# Patient Record
Sex: Male | Born: 1955 | Race: Black or African American | Hispanic: No | Marital: Married | Smoking: Never smoker
Health system: Southern US, Community
[De-identification: ages and names within clinical notes are randomized; demographics above are authoritative.]

## PROBLEM LIST (undated history)

## (undated) DIAGNOSIS — R7303 Prediabetes: Secondary | ICD-10-CM

## (undated) DIAGNOSIS — K219 Gastro-esophageal reflux disease without esophagitis: Secondary | ICD-10-CM

## (undated) DIAGNOSIS — Z9119 Patient's noncompliance with other medical treatment and regimen: Secondary | ICD-10-CM

## (undated) DIAGNOSIS — E785 Hyperlipidemia, unspecified: Secondary | ICD-10-CM

## (undated) DIAGNOSIS — I10 Essential (primary) hypertension: Secondary | ICD-10-CM

## (undated) DIAGNOSIS — Z91199 Patient's noncompliance with other medical treatment and regimen due to unspecified reason: Secondary | ICD-10-CM

## (undated) DIAGNOSIS — E782 Mixed hyperlipidemia: Secondary | ICD-10-CM

## (undated) DIAGNOSIS — I517 Cardiomegaly: Secondary | ICD-10-CM

## (undated) DIAGNOSIS — E669 Obesity, unspecified: Secondary | ICD-10-CM

## (undated) DIAGNOSIS — E038 Other specified hypothyroidism: Secondary | ICD-10-CM

## (undated) DIAGNOSIS — I639 Cerebral infarction, unspecified: Secondary | ICD-10-CM

## (undated) DIAGNOSIS — I2510 Atherosclerotic heart disease of native coronary artery without angina pectoris: Secondary | ICD-10-CM

## (undated) HISTORY — DX: Obesity, unspecified: E66.9

## (undated) HISTORY — DX: Essential (primary) hypertension: I10

## (undated) HISTORY — DX: Other specified hypothyroidism: E03.8

## (undated) HISTORY — DX: Mixed hyperlipidemia: E78.2

## (undated) HISTORY — DX: Hyperlipidemia, unspecified: E78.5

## (undated) HISTORY — DX: Cardiomegaly: I51.7

## (undated) HISTORY — PX: NO PAST SURGERIES: SHX2092

## (undated) HISTORY — DX: Prediabetes: R73.03

## (undated) HISTORY — DX: Patient's noncompliance with other medical treatment and regimen: Z91.19

## (undated) HISTORY — DX: Patient's noncompliance with other medical treatment and regimen due to unspecified reason: Z91.199

## (undated) HISTORY — DX: Gastro-esophageal reflux disease without esophagitis: K21.9

## (undated) HISTORY — DX: Atherosclerotic heart disease of native coronary artery without angina pectoris: I25.10

---

## 2010-02-21 ENCOUNTER — Ambulatory Visit: Payer: Self-pay | Admitting: Cardiovascular Disease

## 2011-02-11 NOTE — Assessment & Plan Note (Signed)
[facility]                        [place] CARDIOLOGY OFFICE NOTE   MADDOCK, FINIGAN                          MRN:          [id number]  DATE:02/21/2010                            DOB:          Oct 24, 1955    CHIEF COMPLAINT:  Abnormal EKG and occasional chest pain.   HISTORY OF PRESENT ILLNESS:  The patient is a 55 year old black male  with past medical history significant for resistant hypertension,  obesity, hyperlipidemia. left ventricular hypertrophy  presenting for evaluation of an abnormal EKG and occasional chest  discomfort.  The patient states that he was working out at the gym  several days a week.  During the work out, he would complete  approximately 30 minutes of walking on a treadmill and do so with very  little difficulty.  He states he does occasionally have episodes in  which he feels he needs to catch his breath and these are sometimes  associated with some mild chest discomfort.  The episodes are self  limiting, they sometimes occurred during exercise and sometimes at rest.  The discomfort does not radiate.  He denies any noticeable lower  extremity edema.  He has been trying to lose weight and be compliant  with his antihypertensives.  Yesterday, he saw Dr. Allyson Sabal and because of  he had run out of his blood pressure medicines and blood pressure was  204/110.  The patient is now back on all of his antihypertensives.   PAST MEDICAL HISTORY:  As above in the HPI.   SOCIAL HISTORY:  No tobacco.  No alcohol.   FAMILY HISTORY:  Negative for premature coronary artery disease.   ALLERGIES:  No known drug allergies.   MEDICATIONS:  1. Aspirin 81 mg daily.  2. Benicar/HCT 40/12.5 mg daily.  3. Toprol-XL 50 mg daily.  4. Pravastatin 40 mg daily.  5. Clonidine 0.2 mg daily.  6. Fish oil.   REVIEW OF SYSTEMS:  As in HPI.  All other systems are reviewed and are  negative.   PHYSICAL EXAMINATION:  VITAL SIGNS:  The patient's blood  pressure is  123/77, pulse is 60, sating 94% on room air, and he weighs 258 pounds.  GENERAL:  No acute distress.  HEENT:  Normocephalic and atraumatic.  NECK:  Supple.  There is no JVD.  No carotid bruits.  HEART:  Regular rate and rhythm without murmur, rub, or gallop.  LUNGS:  Clear bilaterally.  ABDOMEN:  Soft, nontender, and nondistended.  EXTREMITIES:  Trace to 1+ bilateral lower extremity edema.  SKIN:  Warm and dry.  NEUROLOGIC:  Nonfocal.  MUSCULOSKELETAL:  5/5 bilateral upper and lower extremity strength.  PSYCHIATRIC:  The patient is appropriate.   EKG taken today in clinic, independently interpreted by myself,  demonstrates normal sinus rhythm with a probable left ventricular  hypertrophy.  There is a T-wave inversion in the inferior lateral leads  that may be secondary to left ventricular hypertrophy or could represent  ischemic heart disease.  Review of the patient's echocardiogram report  from 2009, at that time the patient had mild-to-moderate concentric left  ventricular hypertrophy.  Probable diastolic dysfunction, trace mitral  regurgitation, and mild left atrial enlargement.   ASSESSMENT:  This is a 55 year old gentleman with resistant  hypertension, hyperlipidemia, obesity, who is presenting with shortness  of breath and chest discomfort that may be secondary to coronary artery  disease.  These symptoms could also be a manifestation of the patient's  deconditioning and in fact that he is overweight.  His EKG abnormalities  could represent ischemia, but I think more likely are secondary to left  ventricular hypertrophy.   PLAN:  The patient is asked to hold off on further exercise until we  complete an exercise Cardiolite study and rule out inducible ischemia.  The need for compliance with his antihypertensives are explained and  stressed and he understands them.  I will see the patient back in clinic  after the results of the stress test.     Brayton El, MD  Electronically Signed    SGA/MedQ  DD: 02/21/2010  DT: 02/22/2010  Job #: [id number]

## 2011-02-11 NOTE — Letter (Signed)
Feb 21, 2010    Brent Bulla, M.D.  [address]  [place], [place] [address]   RE:  KAYLIN, SCHELLENBERG  MRN:  [id number]  /  DOB:  1956-05-06   Dear Dr. Marina Goodell:   I had the pleasure of seeing Mr. Pfahler in clinic this morning.  As you  know he is a 55 year old gentleman with an abnormal EKG and some  intermittent episodes of chest pain and shortness of breath.  Today in  clinic his blood pressure is 123/77 with a pulse of 60.  His EKG shows  normal sinus rhythm with left ventricular hypertrophy and inferior-  lateral T-wave inversions that may represent ischemic heart disease but  could also be secondary to the left ventricular hypertrophy.  Because of  his symptoms and risk factors, I am proceeding with an exercise  Cardiolite in order to rule out inducible ischemia.  Compliance with his  antihypertensives was stressed to the patient and he seems agreeable.   I thank you for the referral of this patient and I look forward to  following him along with you.    Sincerely,      Brayton El, MD  Electronically Signed    SGA/MedQ  DD: 02/21/2010  DT: 02/21/2010  Job #: [id number]

## 2011-06-17 ENCOUNTER — Encounter: Payer: Self-pay | Admitting: Cardiovascular Disease

## 2011-08-07 ENCOUNTER — Encounter: Payer: Self-pay | Admitting: Cardiovascular Disease

## 2013-11-28 ENCOUNTER — Encounter (HOSPITAL_COMMUNITY): Payer: Self-pay | Admitting: Emergency Medicine

## 2013-11-28 ENCOUNTER — Inpatient Hospital Stay (HOSPITAL_COMMUNITY)
Admission: EM | Admit: 2013-11-28 | Discharge: 2013-12-01 | DRG: 064 | Disposition: A | Discharge status: Home / Self Care | Payer: Self-pay | Attending: Internal Medicine | Admitting: Internal Medicine

## 2013-11-28 ENCOUNTER — Emergency Department (HOSPITAL_COMMUNITY): Payer: Self-pay

## 2013-11-28 DIAGNOSIS — I639 Cerebral infarction, unspecified: Secondary | ICD-10-CM | POA: Diagnosis present

## 2013-11-28 DIAGNOSIS — I493 Ventricular premature depolarization: Secondary | ICD-10-CM | POA: Diagnosis present

## 2013-11-28 DIAGNOSIS — G9340 Encephalopathy, unspecified: Secondary | ICD-10-CM | POA: Diagnosis present

## 2013-11-28 DIAGNOSIS — I10 Essential (primary) hypertension: Secondary | ICD-10-CM | POA: Diagnosis present

## 2013-11-28 DIAGNOSIS — Z9119 Patient's noncompliance with other medical treatment and regimen: Secondary | ICD-10-CM

## 2013-11-28 DIAGNOSIS — Z9114 Patient's other noncompliance with medication regimen: Secondary | ICD-10-CM

## 2013-11-28 DIAGNOSIS — I674 Hypertensive encephalopathy: Secondary | ICD-10-CM | POA: Diagnosis present

## 2013-11-28 DIAGNOSIS — I739 Peripheral vascular disease, unspecified: Secondary | ICD-10-CM | POA: Diagnosis present

## 2013-11-28 DIAGNOSIS — I6340 Cerebral infarction due to embolism of unspecified cerebral artery: Principal | ICD-10-CM | POA: Diagnosis present

## 2013-11-28 DIAGNOSIS — Z8673 Personal history of transient ischemic attack (TIA), and cerebral infarction without residual deficits: Secondary | ICD-10-CM

## 2013-11-28 DIAGNOSIS — R9431 Abnormal electrocardiogram [ECG] [EKG]: Secondary | ICD-10-CM | POA: Diagnosis present

## 2013-11-28 DIAGNOSIS — I160 Hypertensive urgency: Secondary | ICD-10-CM | POA: Diagnosis present

## 2013-11-28 DIAGNOSIS — I428 Other cardiomyopathies: Secondary | ICD-10-CM | POA: Diagnosis present

## 2013-11-28 DIAGNOSIS — G454 Transient global amnesia: Secondary | ICD-10-CM | POA: Diagnosis present

## 2013-11-28 DIAGNOSIS — I429 Cardiomyopathy, unspecified: Secondary | ICD-10-CM

## 2013-11-28 DIAGNOSIS — E669 Obesity, unspecified: Secondary | ICD-10-CM | POA: Diagnosis present

## 2013-11-28 DIAGNOSIS — Z6841 Body Mass Index (BMI) 40.0 and over, adult: Secondary | ICD-10-CM

## 2013-11-28 DIAGNOSIS — Z91199 Patient's noncompliance with other medical treatment and regimen due to unspecified reason: Secondary | ICD-10-CM

## 2013-11-28 DIAGNOSIS — E785 Hyperlipidemia, unspecified: Secondary | ICD-10-CM | POA: Diagnosis present

## 2013-11-28 DIAGNOSIS — G459 Transient cerebral ischemic attack, unspecified: Secondary | ICD-10-CM

## 2013-11-28 DIAGNOSIS — Z8249 Family history of ischemic heart disease and other diseases of the circulatory system: Secondary | ICD-10-CM

## 2013-11-28 DIAGNOSIS — Z7982 Long term (current) use of aspirin: Secondary | ICD-10-CM

## 2013-11-28 HISTORY — DX: Cerebral infarction, unspecified: I63.9

## 2013-11-28 LAB — CBC WITH DIFFERENTIAL/PLATELET
Basophils Absolute: 0 10*3/uL (ref 0.0–0.1)
Basophils Relative: 0 % (ref 0–1)
Eosinophils Absolute: 0.1 10*3/uL (ref 0.0–0.7)
Eosinophils Relative: 1 % (ref 0–5)
HCT: 48.8 % (ref 39.0–52.0)
Hemoglobin: 16.1 g/dL (ref 13.0–17.0)
Lymphocytes Relative: 34 % (ref 12–46)
Lymphs Abs: 2.7 10*3/uL (ref 0.7–4.0)
MCH: 29.2 pg (ref 26.0–34.0)
MCHC: 33 g/dL (ref 30.0–36.0)
MCV: 88.6 fL (ref 78.0–100.0)
Monocytes Absolute: 0.8 10*3/uL (ref 0.1–1.0)
Monocytes Relative: 10 % (ref 3–12)
Neutro Abs: 4.4 10*3/uL (ref 1.7–7.7)
Neutrophils Relative %: 56 % (ref 43–77)
Platelets: 229 10*3/uL (ref 150–400)
RBC: 5.51 MIL/uL (ref 4.22–5.81)
RDW: 13.6 % (ref 11.5–15.5)
WBC: 7.9 10*3/uL (ref 4.0–10.5)

## 2013-11-28 LAB — URINALYSIS, ROUTINE W REFLEX MICROSCOPIC
Bilirubin Urine: NEGATIVE
Glucose, UA: NEGATIVE mg/dL
Hgb urine dipstick: NEGATIVE
Ketones, ur: NEGATIVE mg/dL
Leukocytes, UA: NEGATIVE
Nitrite: NEGATIVE
Protein, ur: NEGATIVE mg/dL
Specific Gravity, Urine: 1.023 (ref 1.005–1.030)
Urobilinogen, UA: 1 mg/dL (ref 0.0–1.0)
pH: 6 (ref 5.0–8.0)

## 2013-11-28 LAB — ETHANOL: Alcohol, Ethyl (B): <11 mg/dL (ref 0–11)

## 2013-11-28 LAB — COMPREHENSIVE METABOLIC PANEL
ALT: 21 U/L (ref 0–53)
AST: 19 U/L (ref 0–37)
Albumin: 3.5 g/dL (ref 3.5–5.2)
Alkaline Phosphatase: 66 U/L (ref 39–117)
BUN: 13 mg/dL (ref 6–23)
CO2: 25 mEq/L (ref 19–32)
Calcium: 9.5 mg/dL (ref 8.4–10.5)
Chloride: 104 mEq/L (ref 96–112)
Creatinine, Ser: 1.26 mg/dL (ref 0.50–1.35)
GFR calc Af Amer: 72 mL/min — ABNORMAL LOW (ref >90)
GFR calc non Af Amer: 62 mL/min — ABNORMAL LOW (ref >90)
Glucose, Bld: 86 mg/dL (ref 70–99)
Potassium: 4.2 mEq/L (ref 3.7–5.3)
Sodium: 142 mEq/L (ref 137–147)
Total Bilirubin: 0.9 mg/dL (ref 0.3–1.2)
Total Protein: 7.7 g/dL (ref 6.0–8.3)

## 2013-11-28 LAB — RAPID URINE DRUG SCREEN, HOSP PERFORMED
Amphetamines: NOT DETECTED
Barbiturates: NOT DETECTED
Benzodiazepines: NOT DETECTED
Cocaine: NOT DETECTED
Opiates: NOT DETECTED
Tetrahydrocannabinol: NOT DETECTED

## 2013-11-28 LAB — SALICYLATE LEVEL: Salicylate Lvl: <2 mg/dL — ABNORMAL LOW (ref 2.8–20.0)

## 2013-11-28 LAB — ACETAMINOPHEN LEVEL: Acetaminophen (Tylenol), Serum: <15 ug/mL (ref 10–30)

## 2013-11-28 MED ORDER — ASPIRIN 325 MG PO TABS
325.0000 mg | ORAL_TABLET | Freq: Every day | ORAL | Status: DC
  Administered 2013-11-28 – 2013-11-30 (×3): 325 mg via ORAL
  Filled 2013-11-28 (×3): qty 1

## 2013-11-28 MED ORDER — METOPROLOL SUCCINATE ER 50 MG PO TB24
50.0000 mg | ORAL_TABLET | Freq: Every day | ORAL | Status: DC
  Administered 2013-11-29 – 2013-12-01 (×3): 50 mg via ORAL
  Filled 2013-11-28 (×4): qty 1

## 2013-11-28 MED ORDER — CLONIDINE HCL 0.1 MG PO TABS
0.2000 mg | ORAL_TABLET | Freq: Once | ORAL | Status: AC
  Administered 2013-11-28: 0.2 mg via ORAL
  Filled 2013-11-28: qty 2

## 2013-11-28 MED ORDER — LABETALOL HCL 5 MG/ML IV SOLN: 20.0000 mg | Freq: Once | INTRAVENOUS | Status: DC

## 2013-11-28 MED ORDER — LISINOPRIL 20 MG PO TABS
20.0000 mg | ORAL_TABLET | Freq: Every day | ORAL | Status: DC
  Administered 2013-11-28: 20 mg via ORAL
  Filled 2013-11-28 (×2): qty 1

## 2013-11-28 MED ORDER — ASPIRIN 300 MG RE SUPP
300.0000 mg | Freq: Every day | RECTAL | Status: DC
  Filled 2013-11-28 (×3): qty 1

## 2013-11-28 MED ORDER — HYDROCHLOROTHIAZIDE 12.5 MG PO CAPS
12.5000 mg | ORAL_CAPSULE | Freq: Once | ORAL | Status: AC
  Administered 2013-11-28: 12.5 mg via ORAL
  Filled 2013-11-28 (×2): qty 1

## 2013-11-28 MED ORDER — LABETALOL HCL 5 MG/ML IV SOLN
10.0000 mg | Freq: Once | INTRAVENOUS | Status: AC
  Administered 2013-11-28: 10 mg via INTRAVENOUS
  Filled 2013-11-28: qty 4

## 2013-11-28 MED ORDER — ENOXAPARIN SODIUM 40 MG/0.4ML ~~LOC~~ SOLN
40.0000 mg | SUBCUTANEOUS | Status: DC
  Administered 2013-11-28 – 2013-11-30 (×3): 40 mg via SUBCUTANEOUS
  Filled 2013-11-28 (×5): qty 0.4

## 2013-11-28 MED ORDER — SIMVASTATIN 20 MG PO TABS
20.0000 mg | ORAL_TABLET | Freq: Every day | ORAL | Status: DC
  Administered 2013-11-28 – 2013-12-01 (×4): 20 mg via ORAL
  Filled 2013-11-28 (×5): qty 1

## 2013-11-28 MED ORDER — SODIUM CHLORIDE 0.9 % IV SOLN
INTRAVENOUS | Status: DC
  Administered 2013-11-28: 23:00:00 via INTRAVENOUS

## 2013-11-28 MED ORDER — IRBESARTAN 300 MG PO TABS
300.0000 mg | ORAL_TABLET | Freq: Every day | ORAL | Status: DC
Start: 2013-11-28 — End: 2013-12-01
  Administered 2013-11-28 – 2013-12-01 (×4): 300 mg via ORAL
  Filled 2013-11-28 (×5): qty 1

## 2013-11-28 MED ORDER — HYDROCHLOROTHIAZIDE 25 MG PO TABS
25.0000 mg | ORAL_TABLET | Freq: Every day | ORAL | Status: DC
  Administered 2013-11-29 – 2013-12-01 (×3): 25 mg via ORAL
  Filled 2013-11-28 (×4): qty 1

## 2013-11-28 MED ORDER — HYDRALAZINE HCL 20 MG/ML IJ SOLN
10.0000 mg | Freq: Four times a day (QID) | INTRAMUSCULAR | Status: DC | PRN
  Filled 2013-11-28: qty 0.5

## 2013-11-28 NOTE — H&P (Addendum)
[facility] History and Physical  Scott Richardson UJW:[id number] DOB: 12-May-1956 DOA: 11/28/2013  Referring physician: Dr Juleen China PCP: Dr. Brent Bulla  Chief Complaint:  Altered mental status since one day  HPI:  58 year old obese male with history of hypertension and hyperlipidemia on multiple blood pressure medications (has not been taking any medications for past 9 months stating difficulty affording them ) was brought to the hospital by his family after being found confused since this morning. His wife noticed that since this morning he was talking very little and quite confused during conversation about his orientation, the dates and people. Last evening while having supper with his daughter patient reported to her that he was feeling dizzy with the room spinning around. Patient also complained of some blurry vision and numbness of his bilateral hands today. Patient denies any head trauma or recent illness. He denies any recent travel. He is not compliant with his diet and does not exercise much. Patient denies headache,  fever, chills, nausea , vomiting, chest pain, palpitations, SOB, abdominal pain, bowel or urinary symptoms. Denies change in weight or appetite. No seizure like activity was witnessed. Denies alcohol or substance abuse.  Course in the ED Patient was afebrile. Heart rate was 55. Normal respiratory rate. Blood pressure was elevated to 222/91 mmHg. He was given a dose of 0.2 mg oral clonidine, 12.5 mg of HCTZ, 300 mg all Avapro and 10 mg of IV labetalol x2 after which his blood pressure improved to 142/69 mmHg. Blood work done including CBC and basic metabolic panel was unremarkable. Urine tox was negative . Head CT was negative for acute findings. Once blood pressure started to improve he is mentation was also noted to be  better however on my evaluation the patient was still disoriented during conversation.  Review of Systems:  Constitutional: Denies fever, chills,  diaphoresis, appetite change and fatigue.  HEENT: Denies photophobia, eye pain, redness, hearing loss, ear pain, congestion, sore throat, rhinorrhea, sneezing, mouth sores, trouble swallowing, neck pain, neck stiffness and tinnitus.   Respiratory: Denies SOB, DOE, cough, chest tightness,  and wheezing.   Cardiovascular: Denies chest pain, palpitations and leg swelling.  Gastrointestinal: Denies nausea, vomiting, abdominal pain, diarrhea, constipation, blood in stool and abdominal distention.  Genitourinary: Denies dysuria, urgency, frequency, hematuria, flank pain and difficulty urinating.  Endocrine: Denies: hot or cold intolerance, polyuria, polydipsia. Musculoskeletal: Denies myalgias, back pain, joint swelling, arthralgias and gait problem.  Skin: Denies pallor, rash and wound.  Neurological: dizziness,numbness of hand, confusion  seizures, Denies syncope, weakness, light-headedness,and headaches.  Psychiatric/Behavioral: Confusion, Denies mood changes,  nervousness, sleep disturbance and agitation   Past Medical History  Diagnosis Date  . Chest pain   . HTN (hypertension)   . Obesity   . HLD (hyperlipidemia)   . LVH (left ventricular hypertrophy)    Past Surgical History  Procedure Laterality Date  . No past surgeries     Social History:  reports that he has never smoked. He has never used smokeless tobacco. He reports that he does not drink alcohol or use illicit drugs.  No Known Allergies  History reviewed. No pertinent family history.  Prior to Admission medications   Medication Sig Start Date End Date Taking? Authorizing Provider  Patient not taking any medications     Yes Historical Provider, MD      Yes Historical Provider, MD      Yes Historical Provider, MD      Yes Historical Provider, MD  Yes Historical Provider, MD      Yes Historical Provider, MD     Physical Exam:  Filed Vitals:   11/28/13 1800 11/28/13 1820 11/28/13 1831 11/28/13 1914  BP: 183/80  171/77 171/77 142/69  Pulse: 60  61 55  Temp:    98.2 F (36.8 C)  TempSrc:    Oral  Resp: 16 30  20   SpO2: 97%  97% 97%    Constitutional: Vital signs reviewed.   patient is a middle aged obese male lying in bed in no acute distress HEENT: no pallor, no icterus, moist oral mucosa, no cervical lymphadenopathy, no carotid bruit Cardiovascular: RRR, S1 normal, S2 normal, no MRG Chest: CTAB, no wheezes, rales, or rhonchi Abdominal: Soft. Non-tender, non-distended, bowel sounds are normal, no masses, organomegaly, or guarding present.  Ext: warm,  1+ pitting edema bilaterally  Neurological: A&O x2 ( confused with place and year , confused with his PCP and unable to recall people at work), cranial nerves 2-12 intact, normal motor power tone and reflexes: Normal sensations, normal cerebellar function. Gait not assessed  Labs on Admission:  Basic Metabolic Panel:  Recent Labs Lab 11/28/13 1640  NA 142  K 4.2  CL 104  CO2 25  GLUCOSE 86  BUN 13  CREATININE 1.26  CALCIUM 9.5   Liver Function Tests:  Recent Labs Lab 11/28/13 1640  AST 19  ALT 21  ALKPHOS 66  BILITOT 0.9  PROT 7.7  ALBUMIN 3.5   No results found for this basename: LIPASE, AMYLASE,  in the last 168 hours No results found for this basename: AMMONIA,  in the last 168 hours CBC:  Recent Labs Lab 11/28/13 1640  WBC 7.9  NEUTROABS 4.4  HGB 16.1  HCT 48.8  MCV 88.6  PLT 229   Cardiac Enzymes: No results found for this basename: CKTOTAL, CKMB, CKMBINDEX, TROPONINI,  in the last 168 hours BNP: No components found with this basename: POCBNP,  CBG: No results found for this basename: GLUCAP,  in the last 168 hours  Radiological Exams on Admission: Ct Head Wo Contrast  11/28/2013   CLINICAL DATA:  Encephalopathy, weakness  EXAM: CT HEAD WITHOUT CONTRAST  TECHNIQUE: Contiguous axial images were obtained from the base of the skull through the vertex without intravenous contrast.  COMPARISON:  None.  FINDINGS:  There is no evidence of mass effect, midline shift or extra-axial fluid collections. There is no evidence of a space-occupying lesion or intracranial hemorrhage. There is no evidence of a cortical-based area of acute infarction.  The ventricles and sulci are appropriate for the patient's age. The basal cisterns are patent.  Visualized portions of the orbits are unremarkable. The visualized portions of the paranasal sinuses and mastoid air cells are unremarkable.  The osseous structures are unremarkable.  IMPRESSION: No acute intracranial pathology.   Electronically Signed   By: Elige KoHetal  Patel   On: 11/28/2013 17:12    EKG: Normal sinus rhythm at 77 with PVCs, T wave inversion in the lateral leads which are unchanged from prior EKG. LVH.  Assessment/Plan  Principal Problem: Acute encephalopathy Secondary to TIA/ CVA versus hypertensive encephalopathy. Admit to telemetry under observation. Head CT on admission unremarkable. Workup for CVA with MRI of brain, MRA head, 2-D echo and carotid Dopplers. -Check hemoglobin A1c and lipid profile -Aspirin 325 mg daily. -Zocor 20 mg by mouth daily -Continue neuro checks. -Patient counseled on weight loss, diet restriction and medication compliance.   Active Problems:   Hypertensive urgency Patient  was on multiple blood pressure medications previously and noncompliant secondary to affordability. I would place him on HCTZ and lisinopril. Continue daily Toprol-XL. I discussed with him and his wife about getting $4 prescriptions for his blood pressure at [facility] . -We'll allow permissive hypertension until MRI brain rules out for CVA. -Added When necessary hydralazine. -UA unremarkable. Check 2-D echo given LVH and bilateral leg edema.     Obesity Counseled on diet and exercise.      Diet:cardiac  DVT prophylaxis: sq lovenox   Code Status: full code Family Communication: discussed with wife and daughter at bedside Disposition Plan: Home once  stable  Lillan Mccreadie [facility] Pager [phone]  Total time spent on admission :70 minutes  If 7PM-7AM, please contact night-coverage [email or web address] Password [facility] 11/28/2013, 7:42 PM

## 2013-11-28 NOTE — ED Provider Notes (Signed)
CSN: [id number]     Arrival date & time 11/28/13  1602 History   First MD Initiated Contact with Patient 11/28/13 1620     Chief Complaint  Patient presents with  . Aphasia  . Blurred Vision     (Consider location/radiation/quality/duration/timing/severity/associated sxs/prior Treatment) HPI  58 year old male brought in by wife for evaluation of changes mental status. First noticed a difference earlier today. Vision has been unusually quiet. Confused. Inappropriate comments and cement or appropriate responses to questioning. Patient is complaining of some blurred vision earlier today and some numbness in bilateral hands. He currently has no complaints though. Denies any pain anywhere. No history similar symptoms. Past history of hypertension. On multiple agents but has not taken any of them in the past several months. No fevers or chills. Denies any trauma. Denies any ingestion.   Past Medical History  Diagnosis Date  . Chest pain   . HTN (hypertension)   . Obesity   . HLD (hyperlipidemia)   . LVH (left ventricular hypertrophy)    History reviewed. No pertinent past surgical history. History reviewed. No pertinent family history. History  Substance Use Topics  . Smoking status: Never Smoker   . Smokeless tobacco: Not on file  . Alcohol Use: No    Review of Systems  All systems reviewed and negative, other than as noted in HPI.   Allergies  Review of patient's allergies indicates no known allergies.  Home Medications   Current Outpatient Rx  Name  Route  Sig  Dispense  Refill  . aspirin 81 MG tablet   Oral   Take 81 mg by mouth daily.           . cloNIDine (CATAPRES) 0.2 MG tablet   Oral   Take 0.2 mg by mouth daily.           . fish oil-omega-3 fatty acids 1000 MG capsule   Oral   Take 2 g by mouth daily.           . metoprolol (TOPROL-XL) 50 MG 24 hr tablet   Oral   Take 50 mg by mouth daily.           Marland Kitchen. olmesartan-hydrochlorothiazide (BENICAR HCT)  40-12.5 MG per tablet   Oral   Take 1 tablet by mouth daily.           . pravastatin (PRAVACHOL) 40 MG tablet   Oral   Take 40 mg by mouth daily.            BP 192/84  Pulse 70  Temp(Src) 98.2 F (36.8 C) (Oral)  Resp 16  SpO2 97% Physical Exam  Nursing note and vitals reviewed. Constitutional: He appears well-developed and well-nourished. No distress.  HENT:  Head: Normocephalic and atraumatic.  Eyes: Conjunctivae are normal. Right eye exhibits no discharge. Left eye exhibits no discharge.  Neck: Neck supple.  Cardiovascular: Normal rate, regular rhythm and normal heart sounds.  Exam reveals no gallop and no friction rub.   No murmur heard. Pulmonary/Chest: Effort normal and breath sounds normal. No respiratory distress.  Abdominal: Soft. He exhibits no distension. There is no tenderness.  Musculoskeletal: He exhibits no edema and no tenderness.  Neurological: He is alert.  Affect a little odd. Some inappropriate laughter. When asked his children's name, he gave me the name of his wife and his deceased brother. Gave October and 1920 as month and year. When asked again later in exam he then replied, "the third month" and 1915. Speech  clear. CN 2-12 intact. Strength 5/5 b/l u/l extremities. Got up from bedside chair and onto stretcher w/o apparent difficulty. Good finger to nose b/l.    Skin: Skin is warm and dry.  Psychiatric: He has a normal mood and affect. His behavior is normal. Thought content normal.    ED Course  Procedures (including critical care time) Labs Review Labs Reviewed  SALICYLATE LEVEL - Abnormal; Notable for the following:    Salicylate Lvl <2.0 (*)    All other components within normal limits  COMPREHENSIVE METABOLIC PANEL - Abnormal; Notable for the following:    GFR calc non Af Amer 62 (*)    GFR calc Af Amer 72 (*)    All other components within normal limits  ETHANOL  ACETAMINOPHEN LEVEL  CBC WITH DIFFERENTIAL  URINE RAPID DRUG SCREEN (HOSP  PERFORMED)  URINALYSIS, ROUTINE W REFLEX MICROSCOPIC   Imaging Review Ct Head Wo Contrast  11/28/2013   CLINICAL DATA:  Encephalopathy, weakness  EXAM: CT HEAD WITHOUT CONTRAST  TECHNIQUE: Contiguous axial images were obtained from the base of the skull through the vertex without intravenous contrast.  COMPARISON:  None.  FINDINGS: There is no evidence of mass effect, midline shift or extra-axial fluid collections. There is no evidence of a space-occupying lesion or intracranial hemorrhage. There is no evidence of a cortical-based area of acute infarction.  The ventricles and sulci are appropriate for the patient's age. The basal cisterns are patent.  Visualized portions of the orbits are unremarkable. The visualized portions of the paranasal sinuses and mastoid air cells are unremarkable.  The osseous structures are unremarkable.  IMPRESSION: No acute intracranial pathology.   Electronically Signed   By: Elige Ko   On: 11/28/2013 17:12     EKG Interpretation   Date/Time:  Monday November 28 2013 16:34:08 EST Ventricular Rate:  77 PR Interval:  172 QRS Duration: 100 QT Interval:  384 QTC Calculation: 435 R Axis:   -103 Text Interpretation:  Ectopic atrial rhythm Ventricular premature complex  Left atrial enlargement  Abnormal T, consider ischemia lateral leads but  similar to previous EKG LVH Confirmed by Juleen China  MD, Melody Savidge ([id number]) on  11/28/2013 6:30:15 PM      MDM   Final diagnoses:  Hypertensive encephalopathy     58 year old with encephalopathy. Suspect hypertensive in nature. Neuro exam is otherwise nonfocal. He is afebrile. No history of trauma. No apparent ingestion. CT of the head w/o acute findings. istory of hypertension or on file and has been off his medications for several months. His mental status has improved with improving blood pressure (given home meds and additional labetalol), but is not completely back to his baseline.   Raeford Razor, MD 11/28/13 [phone]

## 2013-11-28 NOTE — Progress Notes (Signed)
   CARE MANAGEMENT ED NOTE 11/28/2013  Patient:  NIMAI, BYARS   Account Number:  [id number]  Date Initiated:  11/28/2013  Documentation initiated by:  Radford Pax  Subjective/Objective Assessment:   Patient presents to ED with confusion, lurred vision and numbness in bilateral hands.     Subjective/Objective Assessment Detail:   Patient with pmhx of chest pain, htn, hld, lvh.     Action/Plan:   Action/Plan Detail:   Anticipated DC Date:       Status Recommendation to Physician:   Result of Recommendation:    Other ED Services  Consult Working Plan    DC Planning Services  Other  PCP issues    Choice offered to / List presented to:            Status of service:  Completed, signed off  ED Comments:   ED Comments Detail:  EDCM psoke to patient at bedisde.  Patient confirms he does not have any insurance or a pcp.  EDCM provided patient with a lsit of pcps who accept self pay patients, list of discounted pharmacies and website needymeds.org for medication assistance, information regarding Medicaid, phone number to inquire about the orange card, list of financila assistance in the community sucha as local churches and [employer] and dental assistance for uninsured patients.  Patient thankful for resources.  EDCM placed resources in patient's belongings bag.

## 2013-11-28 NOTE — ED Notes (Signed)
Per wife pt has had slurred speech today as well as blurred vision. Wife also sts pt is having hard time concentrating, is slow to respond, and "just not himself". Wife sts pt has been repeating questions when asked, last time he saw him normal was last night when she went top sleep. Pt also reports bloody stools.

## 2013-11-29 ENCOUNTER — Encounter (HOSPITAL_COMMUNITY): Payer: Self-pay | Admitting: Neurology

## 2013-11-29 ENCOUNTER — Observation Stay (HOSPITAL_COMMUNITY): Payer: Self-pay

## 2013-11-29 DIAGNOSIS — I6350 Cerebral infarction due to unspecified occlusion or stenosis of unspecified cerebral artery: Secondary | ICD-10-CM

## 2013-11-29 LAB — HEMOGLOBIN A1C
Hgb A1c MFr Bld: 6.2 % — ABNORMAL HIGH (ref <5.7)
Mean Plasma Glucose: 131 mg/dL — ABNORMAL HIGH (ref <117)

## 2013-11-29 LAB — LIPID PANEL
Cholesterol: 176 mg/dL (ref 0–200)
HDL: 27 mg/dL — ABNORMAL LOW (ref >39)
LDL CALC: 130 mg/dL — AB (ref 0–99)
TRIGLYCERIDES: 94 mg/dL (ref <150)
Total CHOL/HDL Ratio: 6.5 RATIO
VLDL: 19 mg/dL (ref 0–40)

## 2013-11-29 NOTE — Progress Notes (Signed)
OT Cancellation Note  Patient Details Name: Scott Richardson MRN: [id number] DOB: 1956-03-08   Cancelled Treatment:    Reason Eval/Treat Not Completed: Other (comment)  Pt is being transferred to [facility] today.  + CVA.    Rin Gorton 11/29/2013, 12:34 PM Marica Otter, OTR/L [phone] 11/29/2013

## 2013-11-29 NOTE — Progress Notes (Signed)
UR completed. Patient changed to inpatient- acute CVA 

## 2013-11-29 NOTE — Progress Notes (Signed)
PT Cancellation Note  Patient Details Name: Scott Richardson MRN: [id number] DOB: 1956/08/13   Cancelled Treatment:    Reason Eval/Treat Not Completed: Medical issues which prohibited therapy + CVA per wife/RN-pt to transfer to [facility]. Will hold PT today and check back another day. Thanks. )   Rebeca Alert, MPT Pager: [phone]

## 2013-11-29 NOTE — Progress Notes (Signed)
[facility] PROGRESS NOTE  Scott Richardson ZOX:[id number] DOB: Oct 12, 1955 DOA: 11/28/2013 PCP: Abigail Miyamoto, MD  Assessment/Plan: Acute CVA -Confirmed by MRI. -Start ASA for secondary stroke prevention. -ECHO/Dopplers/PT/OT. -Neurology evaluation has been requested.  HTN -Allow some permissive HTN given acute CVA. -Continue current medications..  Code Status: Full Code Family Communication: Wife at bedside updated on plan of care.  Disposition Plan: To be determined.   Consultants:  Neurology   Antibiotics:  None   Subjective: Still a little confused.  Objective: Filed Vitals:   11/28/13 2235 11/29/13 0455 11/29/13 1009 11/29/13 1335  BP: 146/66 126/70 150/83 153/89  Pulse: 56 54 55 53  Temp:  98.2 F (36.8 C)  98.1 F (36.7 C)  TempSrc:  Oral  Oral  Resp:  16  18  Height:      Weight:      SpO2:  100%  97%    Intake/Output Summary (Last 24 hours) at 11/29/13 1700 Last data filed at 11/29/13 1400  Gross per 24 hour  Intake  737.5 ml  Output      0 ml  Net  737.5 ml   Filed Weights   11/28/13 2108  Weight: 123.061 kg (271 lb 4.8 oz)    Exam:   General:  awake  Cardiovascular: RRR  Respiratory: CTA B  Abdomen: S/NT/ND/+BS  Extremities: no C/C/E/+pulses   Data Reviewed: Basic Metabolic Panel:  Recent Labs Lab 11/28/13 1640  NA 142  K 4.2  CL 104  CO2 25  GLUCOSE 86  BUN 13  CREATININE 1.26  CALCIUM 9.5   Liver Function Tests:  Recent Labs Lab 11/28/13 1640  AST 19  ALT 21  ALKPHOS 66  BILITOT 0.9  PROT 7.7  ALBUMIN 3.5   No results found for this basename: LIPASE, AMYLASE,  in the last 168 hours No results found for this basename: AMMONIA,  in the last 168 hours CBC:  Recent Labs Lab 11/28/13 1640  WBC 7.9  NEUTROABS 4.4  HGB 16.1  HCT 48.8  MCV 88.6  PLT 229   Cardiac Enzymes: No results found for this basename: CKTOTAL, CKMB, CKMBINDEX, TROPONINI,  in the last 168 hours BNP (last 3  results) No results found for this basename: PROBNP,  in the last 8760 hours CBG: No results found for this basename: GLUCAP,  in the last 168 hours  No results found for this or any previous visit (from the past 240 hour(s)).   Studies: Ct Head Wo Contrast  11/28/2013   CLINICAL DATA:  Encephalopathy, weakness  EXAM: CT HEAD WITHOUT CONTRAST  TECHNIQUE: Contiguous axial images were obtained from the base of the skull through the vertex without intravenous contrast.  COMPARISON:  None.  FINDINGS: There is no evidence of mass effect, midline shift or extra-axial fluid collections. There is no evidence of a space-occupying lesion or intracranial hemorrhage. There is no evidence of a cortical-based area of acute infarction.  The ventricles and sulci are appropriate for the patient's age. The basal cisterns are patent.  Visualized portions of the orbits are unremarkable. The visualized portions of the paranasal sinuses and mastoid air cells are unremarkable.  The osseous structures are unremarkable.  IMPRESSION: No acute intracranial pathology.   Electronically Signed   By: Elige Ko   On: 11/28/2013 17:12   Mr Maxine Glenn Head Wo Contrast  11/29/2013   CLINICAL DATA:  TIA.  Encephalopathy it weakness.  Confusion.  EXAM: MRI HEAD WITHOUT CONTRAST  MRA HEAD WITHOUT CONTRAST  TECHNIQUE:  Multiplanar, multiecho pulse sequences of the brain and surrounding structures were obtained without intravenous contrast. Angiographic images of the head were obtained using MRA technique without contrast.  COMPARISON:  CT head without contrast 11/28/2013  FINDINGS: MRI HEAD FINDINGS  An acute nonhemorrhagic infarct within the left lentiform nucleus measures 3.9 x 1.5 x 2.6 cm. This extends superiorly to the body the caudate. T2 changes are associated. There is partial effacement of the left lateral ventricle compatible with mass effect. No other areas of acute infarct are evident. Minimal periventricular white matter changes are  otherwise within normal limits for age.  No hemorrhage or mass lesion is present. Flow is present in the major intracranial arteries. The globes and orbits are intact. The paranasal sinuses and mastoid air cells are clear.  MRA HEAD FINDINGS  There is a focal stenosis of the right supraclinoid internal carotid artery just beyond the posterior communicating artery. Poststenotic dilation versus fusiform aneurysm measures 2.8 mm. The ICA terminus is normal. The right A1 segment is the dominant vessel. There is a focal high-grade stenosis at the origin of the left A1 segment. The M1 segments are within normal limits bilaterally. The MCA bifurcations are within normal limits. There is mild narrowing of the superior left M2 division. Segmental narrowing is present at the more distal MCA branch vessels and ACA branch vessels bilaterally.  The vertebral arteries are small bilaterally. There is no focal stenosis. The basilar artery is small. Fetal type posterior cerebral arteries are present bilaterally with a small right P1 segment. There is moderate attenuation of PCA branch vessels bilaterally.  IMPRESSION: 1. Acute nonhemorrhagic infarct involving the left lentiform nucleus extending to the left caudate. 2. 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 3. 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 4. Mild narrowing of the left superior division left M2 segment. 5. High-grade stenosis of the proximal left A1 segment. This corresponds with the area of infarction. Critical Value/emergent results were called by telephone at the time of interpretation on 11/29/2013 at 8:33 AM to Dr. Ardyth Harps, who verbally acknowledged these results.   Electronically Signed   By: Gennette Pac M.D.   On: 11/29/2013 08:34   Mr Brain Wo Contrast  11/29/2013   CLINICAL DATA:  TIA.  Encephalopathy it  weakness.  Confusion.  EXAM: MRI HEAD WITHOUT CONTRAST  MRA HEAD WITHOUT CONTRAST  TECHNIQUE: Multiplanar, multiecho pulse sequences of the brain and surrounding structures were obtained without intravenous contrast. Angiographic images of the head were obtained using MRA technique without contrast.  COMPARISON:  CT head without contrast 11/28/2013  FINDINGS: MRI HEAD FINDINGS  An acute nonhemorrhagic infarct within the left lentiform nucleus measures 3.9 x 1.5 x 2.6 cm. This extends superiorly to the body the caudate. T2 changes are associated. There is partial effacement of the left lateral ventricle compatible with mass effect. No other areas of acute infarct are evident. Minimal periventricular white matter changes are otherwise within normal limits for age.  No hemorrhage or mass lesion is present. Flow is present in the major intracranial arteries. The globes and orbits are intact. The paranasal sinuses and mastoid air cells are clear.  MRA HEAD FINDINGS  There is a focal stenosis of the right supraclinoid internal carotid artery just beyond the posterior communicating artery. Poststenotic dilation versus fusiform aneurysm measures 2.8 mm. The ICA terminus is normal. The right A1 segment is the dominant vessel.  There is a focal high-grade stenosis at the origin of the left A1 segment. The M1 segments are within normal limits bilaterally. The MCA bifurcations are within normal limits. There is mild narrowing of the superior left M2 division. Segmental narrowing is present at the more distal MCA branch vessels and ACA branch vessels bilaterally.  The vertebral arteries are small bilaterally. There is no focal stenosis. The basilar artery is small. Fetal type posterior cerebral arteries are present bilaterally with a small right P1 segment. There is moderate attenuation of PCA branch vessels bilaterally.  IMPRESSION: 1. Acute nonhemorrhagic infarct involving the left lentiform nucleus extending to the left  caudate. 2. 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 3. 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 4. Mild narrowing of the left superior division left M2 segment. 5. High-grade stenosis of the proximal left A1 segment. This corresponds with the area of infarction. Critical Value/emergent results were called by telephone at the time of interpretation on 11/29/2013 at 8:33 AM to Dr. Ardyth HarpsHernandez, who verbally acknowledged these results.   Electronically Signed   By: Gennette Pachris  Mattern M.D.   On: 11/29/2013 08:34    Scheduled Meds: . aspirin  300 mg Rectal Daily   Or  . aspirin  325 mg Oral Daily  . enoxaparin (LOVENOX) injection  40 mg Subcutaneous Q24H  . hydrochlorothiazide  25 mg Oral Daily  . irbesartan  300 mg Oral Daily  . metoprolol succinate  50 mg Oral Daily  . simvastatin  20 mg Oral q1800   Continuous Infusions: . sodium chloride 75 mL/hr at 11/28/13 2241    Principal Problem:   CVA (cerebral infarction) Active Problems:   Transient global amnesia   Hypertensive urgency   Obesity   Encephalopathy acute   Hypertensive encephalopathy    Time spent: 35 minutes. Greater than 50% of this time was spent in direct contact with the patient coordinating care.    Chaya JanHERNANDEZ ACOSTA,ESTELA  [facility] Pager [phone]  If 7PM-7AM, please contact night-coverage at [email or web address], password [email or web address] 11/29/2013, 5:00 PM  LOS: 1 day

## 2013-11-29 NOTE — Care Management Note (Signed)
    Page 1 of 1   11/29/2013     12:34:47 PM   CARE MANAGEMENT NOTE 11/29/2013  Patient:  Scott Richardson, Scott Richardson   Account Number:  [id number]  Date Initiated:  11/29/2013  Documentation initiated by:  Lanier Clam  Subjective/Objective Assessment:   58 Y/O M ADMITTED W/TIA     Action/Plan:   FROM HOME.NO INSURANCE.NO PCP.   Anticipated DC Date:  12/02/2013   Anticipated DC Plan:  HOME/SELF CARE      DC Planning Services  CM consult      Choice offered to / List presented to:             Status of service:  In process, will continue to follow Medicare Important Message given?   (If response is "NO", the following Medicare IM given date fields will be blank) Date Medicare IM given:   Date Additional Medicare IM given:    Discharge Disposition:  ACUTE TO ACUTE TRANS  Per UR Regulation:  Reviewed for med. necessity/level of care/duration of stay  If discussed at Long Length of Stay Meetings, dates discussed:    Comments:  11/29/13 Neomi Laidler RN,BSN NCM [phone] ED CM(SEE NOTE) HAS ALREADY PROVIDED W/RESOURCES.+MRI-STROKE.FOR TRANSFER TO [facility] PER MD.

## 2013-11-29 NOTE — Progress Notes (Signed)
Bilateral carotid artery duplex:  1-39% ICA stenosis.  Vertebral artery flow is antegrade.     

## 2013-11-29 NOTE — Consult Note (Signed)
Referring Physician: Ardyth Harps    Chief Complaint: stroke  HPI:                                                                                                                                         Scott Richardson is an 58 y.o. male who has not been taking his BP medications or ASA for the last 9-10 months due to cost. He was brought to the hospital after he was found to be confused, having symptoms of dizziness, and "just not answering questions or being a upbeat as usual".  He was brought to the ED with BP 222/91 and he was admitted. 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 Carotid dopplers were performed showing no ICA stenosis. LDL 130, A1c 6.2.  Echo pending.   Wife at bedside feels the medication was not necessarily a cost issue but but both cost and non-compliance.   Date last known well: Date: 11/27/2013 Time last known well: Unable to determine tPA Given: No: out of window  Past Medical History  Diagnosis Date  . Chest pain   . HTN (hypertension)   . Obesity   . HLD (hyperlipidemia)   . LVH (left ventricular hypertrophy)     Past Surgical History  Procedure Laterality Date  . No past surgeries      Family History  Problem Relation Age of Onset  . Hypertension Mother   . Hypertension Mother   . Hypertension Father    Social History:  reports that he has never smoked. He has never used smokeless tobacco. He reports that he does not drink alcohol or use illicit drugs.  Allergies: No Known Allergies  Medications:                                                                                                                           Prior to Admission:  Prescriptions prior to admission  Medication Sig Dispense Refill  . aspirin 81 MG tablet Take 81 mg by mouth daily.        . cloNIDine (CATAPRES) 0.2 MG tablet Take 0.2 mg by mouth daily.        . fish oil-omega-3 fatty acids 1000 MG capsule Take 2 g by  mouth daily.        . metoprolol (TOPROL-XL) 50  MG 24 hr tablet Take 50 mg by mouth daily.        Marland Kitchen olmesartan-hydrochlorothiazide (BENICAR HCT) 40-12.5 MG per tablet Take 1 tablet by mouth daily.        . pravastatin (PRAVACHOL) 40 MG tablet Take 40 mg by mouth daily.         Scheduled: . aspirin  300 mg Rectal Daily   Or  . aspirin  325 mg Oral Daily  . enoxaparin (LOVENOX) injection  40 mg Subcutaneous Q24H  . hydrochlorothiazide  25 mg Oral Daily  . irbesartan  300 mg Oral Daily  . metoprolol succinate  50 mg Oral Daily  . simvastatin  20 mg Oral q1800    ROS:                                                                                                                                       History obtained from the patient  General ROS: negative for - chills, fatigue, fever, night sweats, weight gain or weight loss Psychological ROS: negative for - behavioral disorder, hallucinations, memory difficulties, mood swings or suicidal ideation Ophthalmic ROS: negative for - blurry vision, double vision, eye pain or loss of vision ENT ROS: negative for - epistaxis, nasal discharge, oral lesions, sore throat, tinnitus or vertigo Allergy and Immunology ROS: negative for - hives or itchy/watery eyes Hematological and Lymphatic ROS: negative for - bleeding problems, bruising or swollen lymph nodes Endocrine ROS: negative for - galactorrhea, hair pattern changes, polydipsia/polyuria or temperature intolerance Respiratory ROS: negative for - cough, hemoptysis, shortness of breath or wheezing Cardiovascular ROS: negative for - chest pain, dyspnea on exertion, edema or irregular heartbeat Gastrointestinal ROS: negative for - abdominal pain, diarrhea, hematemesis, nausea/vomiting or stool incontinence Genito-Urinary ROS: negative for - dysuria, hematuria, incontinence or urinary frequency/urgency Musculoskeletal ROS: negative for - joint swelling or muscular weakness Neurological ROS: as noted  in HPI Dermatological ROS: negative for rash and skin lesion changes  Neurologic Examination:                                                                                                      Blood pressure 153/89, pulse 53, temperature 98.1 F (36.7 C), temperature source Oral, resp. rate 18, height 5\' 7"  (1.702 m), weight 123.061 kg (271 lb 4.8 oz), SpO2 97.00%.   Mental Status: Alert, oriented, thought content appropriate.  Speech fluent without evidence of aphasia.  Able to follow 3 step commands without difficulty. Cranial Nerves: II:  Discs flat bilaterally; Visual fields grossly normal, pupils equal, round, reactive to light and accommodation III,IV, VI: ptosis not present, extra-ocular motions intact bilaterally V,VII: smile asymmetric right, facial light touch sensation normal bilaterally VIII: hearing normal bilaterally IX,X: gag reflex present XI: bilateral shoulder shrug XII: midline tongue extension without atrophy or fasciculations  Motor: Right : Upper extremity   5/5    Left:     Upper extremity   5/5  Lower extremity   5/5     Lower extremity   5/5 Tone and bulk:normal tone throughout; no atrophy noted Sensory: Pinprick and light touch intact throughout, bilaterally Deep Tendon Reflexes:  Right: Upper Extremity   Left: Upper extremity   biceps (C-5 to C-6) 2/4   biceps (C-5 to C-6) 2/4 tricep (C7) 2/4    triceps (C7) 2/4 Brachioradialis (C6) 2/4  Brachioradialis (C6) 2/4  Lower Extremity Lower Extremity  quadriceps (L-2 to L-4) 2/4   quadriceps (L-2 to L-4) 2/4 Achilles (S1) 2/4   Achilles (S1) 2/4  Plantars: Right: downgoing   Left: downgoing Cerebellar: normal finger-to-nose,  normal heel-to-shin test Gait: not tested due to leads.  CV: pulses palpable throughout    Lab Results: Basic Metabolic Panel:  Recent Labs Lab 11/28/13 1640  NA 142  K 4.2  CL 104  CO2 25  GLUCOSE 86  BUN 13  CREATININE 1.26  CALCIUM 9.5    Liver Function  Tests:  Recent Labs Lab 11/28/13 1640  AST 19  ALT 21  ALKPHOS 66  BILITOT 0.9  PROT 7.7  ALBUMIN 3.5   No results found for this basename: LIPASE, AMYLASE,  in the last 168 hours No results found for this basename: AMMONIA,  in the last 168 hours  CBC:  Recent Labs Lab 11/28/13 1640  WBC 7.9  NEUTROABS 4.4  HGB 16.1  HCT 48.8  MCV 88.6  PLT 229    Cardiac Enzymes: No results found for this basename: CKTOTAL, CKMB, CKMBINDEX, TROPONINI,  in the last 168 hours  Lipid Panel:  Recent Labs Lab 11/29/13 0537  CHOL 176  TRIG 94  HDL 27*  CHOLHDL 6.5  VLDL 19  LDLCALC 740*    CBG: No results found for this basename: GLUCAP,  in the last 168 hours  Microbiology: No results found for this or any previous visit.  Coagulation Studies: No results found for this basename: LABPROT, INR,  in the last 72 hours  Imaging: Ct Head Wo Contrast  11/28/2013   CLINICAL DATA:  Encephalopathy, weakness  EXAM: CT HEAD WITHOUT CONTRAST  TECHNIQUE: Contiguous axial images were obtained from the base of the skull through the vertex without intravenous contrast.  COMPARISON:  None.  FINDINGS: There is no evidence of mass effect, midline shift or extra-axial fluid collections. There is no evidence of a space-occupying lesion or intracranial hemorrhage. There is no evidence of a cortical-based area of acute infarction.  The ventricles and sulci are appropriate for the patient's age. The basal cisterns are patent.  Visualized portions of the orbits are unremarkable. The visualized portions of the paranasal sinuses and mastoid air cells are unremarkable.  The osseous structures are unremarkable.  IMPRESSION: No acute intracranial pathology.   Electronically Signed   By: Elige Ko   On: 11/28/2013 17:12   Mr Maxine Glenn Head Wo Contrast  11/29/2013   CLINICAL DATA:  TIA.  Encephalopathy it weakness.  Confusion.  EXAM: MRI HEAD WITHOUT CONTRAST  MRA HEAD WITHOUT CONTRAST  TECHNIQUE: Multiplanar,  multiecho  pulse sequences of the brain and surrounding structures were obtained without intravenous contrast. Angiographic images of the head were obtained using MRA technique without contrast.  COMPARISON:  CT head without contrast 11/28/2013  FINDINGS: MRI HEAD FINDINGS  An acute nonhemorrhagic infarct within the left lentiform nucleus measures 3.9 x 1.5 x 2.6 cm. This extends superiorly to the body the caudate. T2 changes are associated. There is partial effacement of the left lateral ventricle compatible with mass effect. No other areas of acute infarct are evident. Minimal periventricular white matter changes are otherwise within normal limits for age.  No hemorrhage or mass lesion is present. Flow is present in the major intracranial arteries. The globes and orbits are intact. The paranasal sinuses and mastoid air cells are clear.  MRA HEAD FINDINGS  There is a focal stenosis of the right supraclinoid internal carotid artery just beyond the posterior communicating artery. Poststenotic dilation versus fusiform aneurysm measures 2.8 mm. The ICA terminus is normal. The right A1 segment is the dominant vessel. There is a focal high-grade stenosis at the origin of the left A1 segment. The M1 segments are within normal limits bilaterally. The MCA bifurcations are within normal limits. There is mild narrowing of the superior left M2 division. Segmental narrowing is present at the more distal MCA branch vessels and ACA branch vessels bilaterally.  The vertebral arteries are small bilaterally. There is no focal stenosis. The basilar artery is small. Fetal type posterior cerebral arteries are present bilaterally with a small right P1 segment. There is moderate attenuation of PCA branch vessels bilaterally.  IMPRESSION: 1. Acute nonhemorrhagic infarct involving the left lentiform nucleus extending to the left caudate. 2. 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 3. 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 4. Mild narrowing of the left superior division left M2 segment. 5. High-grade stenosis of the proximal left A1 segment. This corresponds with the area of infarction. Critical Value/emergent results were called by telephone at the time of interpretation on 11/29/2013 at 8:33 AM to Dr. Ardyth Harps, who verbally acknowledged these results.   Electronically Signed   By: Gennette Pac M.D.   On: 11/29/2013 08:34   Mr Brain Wo Contrast  11/29/2013   CLINICAL DATA:  TIA.  Encephalopathy it weakness.  Confusion.  EXAM: MRI HEAD WITHOUT CONTRAST  MRA HEAD WITHOUT CONTRAST  TECHNIQUE: Multiplanar, multiecho pulse sequences of the brain and surrounding structures were obtained without intravenous contrast. Angiographic images of the head were obtained using MRA technique without contrast.  COMPARISON:  CT head without contrast 11/28/2013  FINDINGS: MRI HEAD FINDINGS  An acute nonhemorrhagic infarct within the left lentiform nucleus measures 3.9 x 1.5 x 2.6 cm. This extends superiorly to the body the caudate. T2 changes are associated. There is partial effacement of the left lateral ventricle compatible with mass effect. No other areas of acute infarct are evident. Minimal periventricular white matter changes are otherwise within normal limits for age.  No hemorrhage or mass lesion is present. Flow is present in the major intracranial arteries. The globes and orbits are intact. The paranasal sinuses and mastoid air cells are clear.  MRA HEAD FINDINGS  There is a focal stenosis of the right supraclinoid internal carotid artery just beyond the posterior communicating artery. Poststenotic dilation versus fusiform aneurysm measures 2.8 mm. The ICA terminus is normal. The right A1 segment is the dominant vessel. There is a  focal high-grade stenosis at the origin of the left A1 segment. The M1  segments are within normal limits bilaterally. The MCA bifurcations are within normal limits. There is mild narrowing of the superior left M2 division. Segmental narrowing is present at the more distal MCA branch vessels and ACA branch vessels bilaterally.  The vertebral arteries are small bilaterally. There is no focal stenosis. The basilar artery is small. Fetal type posterior cerebral arteries are present bilaterally with a small right P1 segment. There is moderate attenuation of PCA branch vessels bilaterally.  IMPRESSION: 1. Acute nonhemorrhagic infarct involving the left lentiform nucleus extending to the left caudate. 2. 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 3. 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 4. Mild narrowing of the left superior division left M2 segment. 5. High-grade stenosis of the proximal left A1 segment. This corresponds with the area of infarction. Critical Value/emergent results were called by telephone at the time of interpretation on 11/29/2013 at 8:33 AM to Dr. Ardyth HarpsHernandez, who verbally acknowledged these results.   Electronically Signed   By: Gennette Pachris  Mattern M.D.   On: 11/29/2013 08:34       Assessment and plan discussed with with attending physician and they are in agreement.    Felicie MornDavid Smith PA-C [facility] [phone]  11/29/2013, 4:38 PM   Assessment: 58 y.o. male with new acute left internal capsule in the setting of BP 222/91 and BP medication non-compliance.  Etiology likely poorly controled HTN and small vessel disease.   Stroke Risk Factors - hyperlipidemia and hypertension  Recommend: 1) ASA daily 2) Echo pending 3) PT/OT 4) BP control 5) LDL <100  Patient seen and examined together with physician assistant and I concur with the assessment and plan.  Wyatt Portelasvaldo Sadie Hazelett, MD

## 2013-11-29 NOTE — Evaluation (Signed)
Physical Therapy Evaluation Patient Details Name: Scott Richardson MRN: [id number] DOB: [birth year] Today's Date: 11/29/2013 Time: 2595-6387 PT Time Calculation (min): 26 min  PT Assessment / Plan / Recommendation History of Present Illness  Patient is a 58 yo male s/p 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  Clinical Impression  Patient independent with activity, educated on BE FAST stroke signs.  Patient able to perform higher level balance tasks without difficulty. Able to navigate unit without cues.  Patient does report some memory deficits, may benefit from SLP evaluation.     PT Assessment  Patent does not need any further PT services    Follow Up Recommendations    NO PT FOLLOW UP                   Precautions / Restrictions Restrictions Weight Bearing Restrictions: No   Pertinent Vitals/Pain No pain at this time, VSS      Mobility  Bed Mobility Overal bed mobility: Independent Transfers Overall transfer level: Independent Ambulation/Gait Ambulation/Gait assistance: Independent Ambulation Distance (Feet): 640 Feet Assistive device: None Gait Pattern/deviations: WFL(Within Functional Limits) Gait velocity: wfl Gait velocity interpretation: at or above normal speed for age/gender Stairs: Yes Stairs assistance: Independent Stair Management: No rails;Alternating pattern;Forwards Number of Stairs: 5 (x2)      PT Goals(Current goals can be found in the care plan section) Acute Rehab PT Goals Patient Stated Goal: to go home PT Goal Formulation: No goals set, d/c therapy Time For Goal Achievement: 12/13/13  Visit Information  Last PT Received On: 11/29/13 Assistance Needed: +1 Reason Eval/Treat Not Completed: Medical issues which prohibited therapy (+ CVA per wife/RN-pt to transfer to [facility]. Will hold PT today and check back another day. Thanks. ) History of Present Illness: Patient is a 58 yo male s/p Acute nonhemorrhagic infarct involving  the left lentiform nucleus.       Prior Functioning  Home Living Family/patient expects to be discharged to:: Private residence Living Arrangements: Spouse/significant other Available Help at Discharge: Family Type of Home: House Home Access: Stairs to enter Secretary/administrator of Steps: 3 Entrance Stairs-Rails: None Home Layout: One level Home Equipment: None Prior Function Level of Independence: Independent Communication Communication: No difficulties Dominant Hand: Right    Cognition  Cognition Arousal/Alertness: Awake/alert Behavior During Therapy: WFL for tasks assessed/performed Overall Cognitive Status: Within Functional Limits for tasks assessed    Extremity/Trunk Assessment Upper Extremity Assessment Upper Extremity Assessment: Overall WFL for tasks assessed Lower Extremity Assessment Lower Extremity Assessment: Overall WFL for tasks assessed   Balance Balance Overall balance assessment: No apparent balance deficits (not formally assessed) Standardized Balance Assessment Standardized Balance Assessment : Dynamic Gait Index Dynamic Gait Index Level Surface: Normal Change in Gait Speed: Normal Gait with Horizontal Head Turns: Normal Gait with Vertical Head Turns: Normal Gait and Pivot Turn: Normal Step Over Obstacle: Normal Step Around Obstacles: Normal Steps: Normal Total Score: 24  End of Session PT - End of Session Equipment Utilized During Treatment: Gait belt Activity Tolerance: Patient tolerated treatment well Patient left: in bed;with call bell/phone within reach;with bed alarm set;with family/visitor present Nurse Communication: Mobility status  GP     Fabio Asa 11/29/2013, 3:18 PM Charlotte Crumb, PT DPT  [phone]

## 2013-11-30 DIAGNOSIS — G9340 Encephalopathy, unspecified: Secondary | ICD-10-CM

## 2013-11-30 DIAGNOSIS — I6350 Cerebral infarction due to unspecified occlusion or stenosis of unspecified cerebral artery: Secondary | ICD-10-CM

## 2013-11-30 DIAGNOSIS — G459 Transient cerebral ischemic attack, unspecified: Secondary | ICD-10-CM

## 2013-11-30 MED ORDER — CLOPIDOGREL BISULFATE 75 MG PO TABS
75.0000 mg | ORAL_TABLET | Freq: Every day | ORAL | Status: DC
  Administered 2013-11-30 – 2013-12-01 (×2): 75 mg via ORAL
  Filled 2013-11-30 (×2): qty 1

## 2013-11-30 MED ORDER — ASPIRIN 81 MG PO TABS
81.0000 mg | ORAL_TABLET | Freq: Every day | ORAL | Status: DC
  Administered 2013-12-01: 81 mg via ORAL
  Filled 2013-11-30: qty 1

## 2013-11-30 MED ORDER — SODIUM CHLORIDE 0.9 % IV SOLN: INTRAVENOUS | Status: DC

## 2013-11-30 NOTE — Evaluation (Signed)
Occupational Therapy Evaluation Patient Details Name: Scott BankerJames Richardson MRN: [id number] DOB: Sep 22, 1956 Today's Date: 11/30/2013 Time: [id number] OT Time Calculation (min): 22 min  OT Assessment / Plan / Recommendation History of present illness Patient is a 58 yo male s/p 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   Clinical Impression   Pt admitted with above. He demonstrates the below listed deficits and will benefit from continued OT to maximize safety and independence with BADLs.  Pt presents to OT with cognitive and visual deficits.  Pt unable to read, and provides contradictory info to questions.  He will need 24 hour supervision at discharge.  Recommend no driving, and OPOT.     OT Assessment  Patient needs continued OT Services    Follow Up Recommendations  Outpatient OT;Supervision/Assistance - 24 hour    Barriers to Discharge   unsure - wife not present  Equipment Recommendations  None recommended by OT    Recommendations for Other Services    Frequency  Min 2X/week    Precautions / Restrictions Restrictions Weight Bearing Restrictions: No   Pertinent Vitals/Pain     ADL  Eating/Feeding: Independent Where Assessed - Eating/Feeding: Edge of bed;Chair Grooming: Wash/dry hands;Wash/dry face;Teeth care;Brushing hair;Supervision/safety Where Assessed - Grooming: Unsupported standing Upper Body Bathing: Supervision/safety Where Assessed - Upper Body Bathing: Supported sitting Lower Body Bathing: Supervision/safety Where Assessed - Lower Body Bathing: Unsupported sit to stand Upper Body Dressing: Supervision/safety Where Assessed - Upper Body Dressing: Supported sitting Lower Body Dressing: Supervision/safety Where Assessed - Lower Body Dressing: Unsupported sit to stand Toilet Transfer: Independent Toilet Transfer Method: Sit to stand;Stand pivot AcupuncturistToilet Transfer Equipment: Comfort height toilet Toileting - Clothing Manipulation and Hygiene:  Independent Where Assessed - Engineer, miningToileting Clothing Manipulation and Hygiene: Standing Tub/Shower Transfer: Designer, industrial/productndependent Tub/Shower Transfer Method: Ambulating Transfers/Ambulation Related to ADLs: independent ADL Comments: Pt able to do what is asked of him. Unsure if pt will initiate ADLs or other activity.  Pt was able to path find on unit to locate room.      OT Diagnosis: Cognitive deficits;Disturbance of vision  OT Problem List: Impaired vision/perception;Decreased cognition;Decreased safety awareness OT Treatment Interventions: Self-care/ADL training;DME and/or AE instruction;Therapeutic activities;Cognitive remediation/compensation;Patient/family education;Visual/perceptual remediation/compensation   OT Goals(Current goals can be found in the care plan section) Acute Rehab OT Goals Patient Stated Goal: to go home OT Goal Formulation: With patient Time For Goal Achievement: 12/14/13 Potential to Achieve Goals: Good ADL Goals Additional ADL Goal #1: Pt will initiate and perform BADLs routine independently  Additional ADL Goal #2: Pt will read newspaper article with no cues Additional ADL Goal #3: Pt will recall at least 3 events of day without cues Additional ADL Goal #4: wife will verbalize understanding how to cue pt for improved cognition and safety   Visit Information  Last OT Received On: 11/30/13 Assistance Needed: +1 History of Present Illness: Patient is a 58 yo male s/p 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       Prior Functioning     Home Living Family/patient expects to be discharged to:: Private residence Living Arrangements: Spouse/significant other Available Help at Discharge: Family Type of Home: House Home Access: Stairs to enter Secretary/administratorntrance Stairs-Number of Steps: 3 Entrance Stairs-Rails: None Home Layout: One level Home Equipment: None Prior Function Level of Independence: Independent Comments: Pt unable to provide info re:  employment or wife's employment Communication Communication: No difficulties Dominant Hand: Right         Vision/Perception Vision - History Baseline Vision: Wears  glasses for distance only (unsure of accuracy of response) Patient Visual Report: No change from baseline Vision - Assessment Eye Alignment: Within Functional Limits Vision Assessment: Vision tested Ocular Range of Motion: Within Functional Limits Alignment/Gaze Preference: Within Defined Limits Tracking/Visual Pursuits: Decreased smoothness of vertical tracking Saccades: Undershoots;Additional eye shifts occurred during testing Visual Fields: No apparent deficits Additional Comments: Pt with mild nystagmus noted.  Pt denies dizziness.  When reading menu, pt unable to locate category for breakfast entree's located on the left.  When cued that it was on left still could not locate.  Pt. with multiple errors when reading with no awareness of errors.  When asked to re-read info, and cues provided, was then able to read words accurately.  When asked if he typically reads at home, he states he does not.  However, when he was asked what he does in his spare time, he states he reads the newspaper. Perception Perception: Within Functional Limits Praxis Praxis: Intact   Cognition  Cognition Arousal/Alertness: Awake/alert Behavior During Therapy: Flat affect Overall Cognitive Status: Impaired/Different from baseline Area of Impairment: Attention;Following commands;Memory;Safety/judgement;Problem solving Current Attention Level: Selective Memory: Decreased short-term memory Following Commands: Follows multi-step commands consistently Safety/Judgement: Decreased awareness of deficits Problem Solving: Slow processing;Difficulty sequencing General Comments: Pt provided inappropriate answers to questions.  Unable to state what he does for employment.  When asked who he lives with he states "Mrs. Tamargo".  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  When asked what his wife does for a living he states Dispensing optician for group homes".  When asked what type of engineer, he was unable to provide info.  When given choices, he says "electrical".  Pt somewhat evasive with answers.      Extremity/Trunk Assessment Upper Extremity Assessment Upper Extremity Assessment: Overall WFL for tasks assessed Lower Extremity Assessment Lower Extremity Assessment: Defer to PT evaluation Cervical / Trunk Assessment Cervical / Trunk Assessment: Normal     Mobility Bed Mobility Overal bed mobility: Independent Transfers Overall transfer level: Independent     Exercise     Balance     End of Session OT - End of Session Activity Tolerance: Patient tolerated treatment well Patient left: in bed;with call bell/phone within reach  GO     Lettie Czarnecki M 11/30/2013, 2:03 PM

## 2013-11-30 NOTE — Progress Notes (Signed)
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

## 2013-11-30 NOTE — Progress Notes (Addendum)
Chart reviwed [facility] PROGRESS NOTE  Kyris Marschke RNH:[id number] DOB: 17-Jul-1956 DOA: 11/28/2013 PCP: Abigail Miyamoto, MD  Assessment/Plan: Acute CVA, likely embolic -Confirmed by MRI. Plavix  And ASA 81 mg per neuro -ECHO/Dopplers/PT/OT. -Neurology consulted TEE tomorrow Speech consult for cognitive eval  HTN Cont current  HLD on statin  Code Status: Full Code Family Communication: Wife at bedside updated on plan of care.  Disposition Plan: home after TEE   Consultants:  Neurology   Antibiotics:  None   Subjective: Improving per wife not back to baseline  Objective: Filed Vitals:   11/30/13 0545 11/30/13 1027 11/30/13 1419 11/30/13 1907  BP: 144/77 148/89 141/82 152/70  Pulse: 57 54 55 61  Temp: 97.8 F (36.6 C) 98.1 F (36.7 C) 98.1 F (36.7 C) 98.2 F (36.8 C)  TempSrc: Oral Oral Oral Oral  Resp: 20 20 20 20   Height:      Weight:      SpO2: 100% 100% 100% 98%    Intake/Output Summary (Last 24 hours) at 11/30/13 2023 Last data filed at 11/30/13 1800  Gross per 24 hour  Intake    120 ml  Output      0 ml  Net    120 ml   Filed Weights   11/28/13 2108  Weight: 123.061 kg (271 lb 4.8 oz)    Exam:   General:  awake  Cardiovascular: RRR  Respiratory: CTA B  Abdomen: S/NT/ND/+BS  Extremities: no C/C/E/+pulses  Neuro: nonfocal. Does not remember what brought him to hospitsl  Data Reviewed: Basic Metabolic Panel:  Recent Labs Lab 11/28/13 1640  NA 142  K 4.2  CL 104  CO2 25  GLUCOSE 86  BUN 13  CREATININE 1.26  CALCIUM 9.5   Liver Function Tests:  Recent Labs Lab 11/28/13 1640  AST 19  ALT 21  ALKPHOS 66  BILITOT 0.9  PROT 7.7  ALBUMIN 3.5   No results found for this basename: LIPASE, AMYLASE,  in the last 168 hours No results found for this basename: AMMONIA,  in the last 168 hours CBC:  Recent Labs Lab 11/28/13 1640  WBC 7.9  NEUTROABS 4.4  HGB 16.1  HCT 48.8  MCV 88.6  PLT 229   Cardiac  Enzymes: No results found for this basename: CKTOTAL, CKMB, CKMBINDEX, TROPONINI,  in the last 168 hours BNP (last 3 results) No results found for this basename: PROBNP,  in the last 8760 hours CBG: No results found for this basename: GLUCAP,  in the last 168 hours  No results found for this or any previous visit (from the past 240 hour(s)).   Studies: Mr Shirlee Latch Wo Contrast  11/29/2013   CLINICAL DATA:  TIA.  Encephalopathy it weakness.  Confusion.  EXAM: MRI HEAD WITHOUT CONTRAST  MRA HEAD WITHOUT CONTRAST  TECHNIQUE: Multiplanar, multiecho pulse sequences of the brain and surrounding structures were obtained without intravenous contrast. Angiographic images of the head were obtained using MRA technique without contrast.  COMPARISON:  CT head without contrast 11/28/2013  FINDINGS: MRI HEAD FINDINGS  An acute nonhemorrhagic infarct within the left lentiform nucleus measures 3.9 x 1.5 x 2.6 cm. This extends superiorly to the body the caudate. T2 changes are associated. There is partial effacement of the left lateral ventricle compatible with mass effect. No other areas of acute infarct are evident. Minimal periventricular white matter changes are otherwise within normal limits for age.  No hemorrhage or mass lesion is present. Flow is present in the major  intracranial arteries. The globes and orbits are intact. The paranasal sinuses and mastoid air cells are clear.  MRA HEAD FINDINGS  There is a focal stenosis of the right supraclinoid internal carotid artery just beyond the posterior communicating artery. Poststenotic dilation versus fusiform aneurysm measures 2.8 mm. The ICA terminus is normal. The right A1 segment is the dominant vessel. There is a focal high-grade stenosis at the origin of the left A1 segment. The M1 segments are within normal limits bilaterally. The MCA bifurcations are within normal limits. There is mild narrowing of the superior left M2 division. Segmental narrowing is present at  the more distal MCA branch vessels and ACA branch vessels bilaterally.  The vertebral arteries are small bilaterally. There is no focal stenosis. The basilar artery is small. Fetal type posterior cerebral arteries are present bilaterally with a small right P1 segment. There is moderate attenuation of PCA branch vessels bilaterally.  IMPRESSION: 1. Acute nonhemorrhagic infarct involving the left lentiform nucleus extending to the left caudate. 2. 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 3. 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 4. Mild narrowing of the left superior division left M2 segment. 5. High-grade stenosis of the proximal left A1 segment. This corresponds with the area of infarction. Critical Value/emergent results were called by telephone at the time of interpretation on 11/29/2013 at 8:33 AM to Dr. Ardyth HarpsHernandez, who verbally acknowledged these results.   Electronically Signed   By: Gennette Pachris  Mattern M.D.   On: 11/29/2013 08:34   Mr Brain Wo Contrast  11/29/2013   CLINICAL DATA:  TIA.  Encephalopathy it weakness.  Confusion.  EXAM: MRI HEAD WITHOUT CONTRAST  MRA HEAD WITHOUT CONTRAST  TECHNIQUE: Multiplanar, multiecho pulse sequences of the brain and surrounding structures were obtained without intravenous contrast. Angiographic images of the head were obtained using MRA technique without contrast.  COMPARISON:  CT head without contrast 11/28/2013  FINDINGS: MRI HEAD FINDINGS  An acute nonhemorrhagic infarct within the left lentiform nucleus measures 3.9 x 1.5 x 2.6 cm. This extends superiorly to the body the caudate. T2 changes are associated. There is partial effacement of the left lateral ventricle compatible with mass effect. No other areas of acute infarct are evident. Minimal periventricular white matter changes are otherwise within normal limits for age.  No  hemorrhage or mass lesion is present. Flow is present in the major intracranial arteries. The globes and orbits are intact. The paranasal sinuses and mastoid air cells are clear.  MRA HEAD FINDINGS  There is a focal stenosis of the right supraclinoid internal carotid artery just beyond the posterior communicating artery. Poststenotic dilation versus fusiform aneurysm measures 2.8 mm. The ICA terminus is normal. The right A1 segment is the dominant vessel. There is a focal high-grade stenosis at the origin of the left A1 segment. The M1 segments are within normal limits bilaterally. The MCA bifurcations are within normal limits. There is mild narrowing of the superior left M2 division. Segmental narrowing is present at the more distal MCA branch vessels and ACA branch vessels bilaterally.  The vertebral arteries are small bilaterally. There is no focal stenosis. The basilar artery is small. Fetal type posterior cerebral arteries are present bilaterally with a small right P1 segment. There is moderate attenuation of PCA branch vessels bilaterally.  IMPRESSION: 1. Acute nonhemorrhagic infarct involving the left lentiform nucleus extending to the left caudate. 2. Moderate stenosis within the  supra clinoid right internal carotid artery just beyond the right posterior communicating artery. 3. 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 4. Mild narrowing of the left superior division left M2 segment. 5. High-grade stenosis of the proximal left A1 segment. This corresponds with the area of infarction. Critical Value/emergent results were called by telephone at the time of interpretation on 11/29/2013 at 8:33 AM to Dr. Ardyth Harps, who verbally acknowledged these results.   Electronically Signed   By: Gennette Pac M.D.   On: 11/29/2013 08:34    Scheduled Meds: . [START ON 12/01/2013] aspirin  81 mg Oral Daily  . clopidogrel  75 mg Oral Q breakfast  . enoxaparin  (LOVENOX) injection  40 mg Subcutaneous Q24H  . hydrochlorothiazide  25 mg Oral Daily  . irbesartan  300 mg Oral Daily  . metoprolol succinate  50 mg Oral Daily  . simvastatin  20 mg Oral q1800   Continuous Infusions:   Time spent: 25 min  Kalley Nicholl L  [facility]  If 7PM-7AM, please contact night-coverage at [email or web address], password [facility] 11/30/2013, 8:23 PM  LOS: 2 days

## 2013-11-30 NOTE — Progress Notes (Signed)
    [facility] has been requested to perform a transesophageal echocardiogram on 3/5 for CVA.  After careful review of history and examination, the risks and benefits of transesophageal echocardiogram have been explained including risks of esophageal damage, perforation (1:10,000 risk), bleeding, pharyngeal hematoma as well as other potential complications associated with conscious sedation including aspiration, arrhythmia, respiratory failure and death. Alternatives to treatment were discussed, questions were answered. Patient is willing to proceed. This is scheduled tomorrow at 8am with Dr.Nahser.  Ronie Spies, PA-C 11/30/2013 4:32 PM

## 2013-11-30 NOTE — Progress Notes (Signed)
Stroke Team Progress Note  HISTORY Scott Richardson is an 58 y.o. male who has not been taking his BP medications or ASA for the last 9-10 months due to cost. He was brought to the hospital after he was found to be confused, having symptoms of dizziness, and "just not answering questions or being a upbeat as usual". He was brought to the ED 11/29/2013 with BP 222/91 and he was admitted. 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 Carotid dopplers were performed showing no ICA stenosis. LDL 130, A1c 6.2. Echo pending.  Wife at bedside feels the medication was not necessarily a cost issue but but both cost and non-compliance. He was last known well 11/27/2013, unable to determine time. Patient was not administerd TPA secondary to delay in arrival. He was admitted for further evaluation and treatment.  SUBJECTIVE His wife is at the bedside.  Overall he feels his condition is stable.   OBJECTIVE Most recent Vital Signs: Filed Vitals:   11/29/13 2325 11/30/13 0113 11/30/13 0545 11/30/13 1027  BP: 139/70 161/82 144/77 148/89  Pulse: 53 59 57 54  Temp: 98.1 F (36.7 C) 97.5 F (36.4 C) 97.8 F (36.6 C) 98.1 F (36.7 C)  TempSrc: Oral Oral Oral Oral  Resp: 18 20 20 20   Height:      Weight:      SpO2: 96% 100% 100% 100%   CBG (last 3)  No results found for this basename: GLUCAP,  in the last 72 hours  IV Fluid Intake:     MEDICATIONS  . aspirin  300 mg Rectal Daily   Or  . aspirin  325 mg Oral Daily  . enoxaparin (LOVENOX) injection  40 mg Subcutaneous Q24H  . hydrochlorothiazide  25 mg Oral Daily  . irbesartan  300 mg Oral Daily  . metoprolol succinate  50 mg Oral Daily  . simvastatin  20 mg Oral q1800   PRN:  hydrALAZINE  Diet:  Cardiac thin liquids Activity:  OOB with assistance DVT Prophylaxis:  Lovenox 40 mg sq daily   CLINICALLY SIGNIFICANT STUDIES Basic Metabolic Panel:  Recent Labs Lab 11/28/13 1640  NA 142  K  4.2  CL 104  CO2 25  GLUCOSE 86  BUN 13  CREATININE 1.26  CALCIUM 9.5   Liver Function Tests:  Recent Labs Lab 11/28/13 1640  AST 19  ALT 21  ALKPHOS 66  BILITOT 0.9  PROT 7.7  ALBUMIN 3.5   CBC:  Recent Labs Lab 11/28/13 1640  WBC 7.9  NEUTROABS 4.4  HGB 16.1  HCT 48.8  MCV 88.6  PLT 229   Coagulation: No results found for this basename: LABPROT, INR,  in the last 168 hours Cardiac Enzymes: No results found for this basename: CKTOTAL, CKMB, CKMBINDEX, TROPONINI,  in the last 168 hours Urinalysis:  Recent Labs Lab 11/28/13 1831  COLORURINE YELLOW  LABSPEC 1.023  PHURINE 6.0  GLUCOSEU NEGATIVE  HGBUR NEGATIVE  BILIRUBINUR NEGATIVE  KETONESUR NEGATIVE  PROTEINUR NEGATIVE  UROBILINOGEN 1.0  NITRITE NEGATIVE  LEUKOCYTESUR NEGATIVE   Lipid Panel    Component Value Date/Time   CHOL 176 11/29/2013 0537   TRIG 94 11/29/2013 0537   HDL 27* 11/29/2013 0537   CHOLHDL 6.5 11/29/2013 0537   VLDL 19 11/29/2013 0537   LDLCALC 130* 11/29/2013 0537   HgbA1C  Lab Results  Component Value Date   HGBA1C 6.2* 11/29/2013    Urine Drug Screen:  Component Value Date/Time   LABOPIA NONE DETECTED 11/28/2013 1831   COCAINSCRNUR NONE DETECTED 11/28/2013 1831   LABBENZ NONE DETECTED 11/28/2013 1831   AMPHETMU NONE DETECTED 11/28/2013 1831   THCU NONE DETECTED 11/28/2013 1831   LABBARB NONE DETECTED 11/28/2013 1831    Alcohol Level:  Recent Labs Lab 11/28/13 1640  ETH <11    CT of the brain  11/28/2013  No acute intracranial pathology.  MRI of the brain  11/29/2013    1. Acute nonhemorrhagic infarct involving the left lentiform nucleus extending to the left caudate.   MRA of the brain  11/29/2013     2. 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 3. 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 4. Mild narrowing of the left superior division left M2  segment. 5. High-grade stenosis of the proximal left A1 segment.   2D Echocardiogram    Carotid Doppler  No evidence of hemodynamically significant internal carotid artery stenosis. Vertebral artery flow is antegrade.   EKG  ectopic atrial arrhythmia, no STEMI. For complete results please see formal report.   Therapy Recommendations no PT. OP OT  Physical Exam   Middle aged male not in distress.Awake alert. Afebrile. Head is nontraumatic. Neck is supple without bruit. Hearing is normal. Cardiac exam no murmur or gallop. Lungs are clear to auscultation. Distal pulses are well felt. Neurological Exam :  Awake alert oriented x 3 Mildly hesistant  speech  But normal language. Mild right lower face asymmetry. Tongue midline. No drift. Mild diminished fine finger movements on right. Orbits left over rightt upper extremity. Mild right grip weak.. Normal sensation . Normal coordination. ASSESSMENT Scott Richardson is a 59 y.o. male presenting with confusion and dizziness. Imaging confirms a left lenticular nucleus/caudate infarct; L M2 high-grade stenosis. Infarct felt to be embolic secondary to unknown etiology.  On aspirin 81 mg orally every day prior to admission. Now on aspirin 325 mg orally every day for secondary stroke prevention. Patient with resultant mild confusion per wife. Stroke work up underway.  hypertension Hyperlipidemia, LDL 130, on pravachol and fish oil PTA, now on zocor 20 mg daily, goal LDL < 100  LVH  Morbid obesity, Body mass index is 42.48 kg/(m^2).   Snores loudly at night, sleepy in the am, cat-naps often per wife  Hospital day # 2  TREATMENT/PLAN  Change to  aspirin 81 mg orally every day and clopidogrel 75 mg orally every day for secondary stroke prevention.  OP eval for obstructive sleep apnea  F/u 2D echo TEE to look for embolic source. Arranged with [facility] for tomorrow.  If positive for PFO (patent foramen ovale), check bilateral  lower extremity venous dopplers to rule out DVT as possible source of stroke. (I have made patient NPO after midnight tonight).   Annie Main, MSN, RN, ANVP-BC, AGPCNP-BC [facility] Pager: [phone] 11/30/2013 11:21 AM  I have personally obtained a history, examined the patient, evaluated imaging results, and formulated the assessment and plan of care. I agree with the above. Delia Heady, MD

## 2013-11-30 NOTE — Clinical Documentation Improvement (Signed)
Possible Clinical Conditions?   Accelerated Hypertension Malignant Hypertension Or Other Condition__________ Cannot Clinically Determine   Supporting Information: Pt. Admitted with confusion and increased BP, treated with additional BP meds  in ED.   Per 3/ 11/29/13 progress notes & H&P  Patient with  Acute Encephalopathy and HTN Encephalopathy.   Risk Factors: History of  HTN, highest BP in ED on 11/28/13 = 222/91.   given home meds and additional labetalol   Signs and Symptoms: SBP range:  3/315 - 11/30/13  139- 161 DBP range:   11/29/13 - 11/30/13   70 -82   Thank You, Shelda Pal ,RN Clinical Documentation Specialist:  [phone]  [facility]- Health Information Management

## 2013-12-01 ENCOUNTER — Encounter (HOSPITAL_COMMUNITY)
Admission: EM | Disposition: A | Discharge status: Home / Self Care | Payer: Self-pay | Source: Home / Self Care | Attending: Internal Medicine

## 2013-12-01 ENCOUNTER — Encounter (HOSPITAL_COMMUNITY): Payer: Self-pay | Admitting: *Deleted

## 2013-12-01 DIAGNOSIS — Z9119 Patient's noncompliance with other medical treatment and regimen: Secondary | ICD-10-CM

## 2013-12-01 DIAGNOSIS — R9431 Abnormal electrocardiogram [ECG] [EKG]: Secondary | ICD-10-CM | POA: Diagnosis present

## 2013-12-01 DIAGNOSIS — I10 Essential (primary) hypertension: Secondary | ICD-10-CM

## 2013-12-01 DIAGNOSIS — E785 Hyperlipidemia, unspecified: Secondary | ICD-10-CM

## 2013-12-01 DIAGNOSIS — I428 Other cardiomyopathies: Secondary | ICD-10-CM

## 2013-12-01 DIAGNOSIS — Z91199 Patient's noncompliance with other medical treatment and regimen due to unspecified reason: Secondary | ICD-10-CM

## 2013-12-01 DIAGNOSIS — I493 Ventricular premature depolarization: Secondary | ICD-10-CM | POA: Diagnosis present

## 2013-12-01 DIAGNOSIS — Z9114 Patient's other noncompliance with medication regimen: Secondary | ICD-10-CM

## 2013-12-01 DIAGNOSIS — I059 Rheumatic mitral valve disease, unspecified: Secondary | ICD-10-CM

## 2013-12-01 HISTORY — PX: TEE WITHOUT CARDIOVERSION: SHX5443

## 2013-12-01 SURGERY — ECHOCARDIOGRAM, TRANSESOPHAGEAL
Anesthesia: Moderate Sedation

## 2013-12-01 MED ORDER — FENTANYL CITRATE 0.05 MG/ML IJ SOLN
INTRAMUSCULAR | Status: DC | PRN
  Administered 2013-12-01: 50 ug via INTRAVENOUS

## 2013-12-01 MED ORDER — BUTAMBEN-TETRACAINE-BENZOCAINE 2-2-14 % EX AERO
INHALATION_SPRAY | CUTANEOUS | Status: DC | PRN
  Administered 2013-12-01: 2 via TOPICAL

## 2013-12-01 MED ORDER — CLOPIDOGREL BISULFATE 75 MG PO TABS: 75.0000 mg | ORAL_TABLET | Freq: Every day | ORAL | Status: DC

## 2013-12-01 MED ORDER — IRBESARTAN 300 MG PO TABS: 300.0000 mg | ORAL_TABLET | Freq: Every day | ORAL | Status: DC

## 2013-12-01 MED ORDER — HYDROCHLOROTHIAZIDE 25 MG PO TABS: 25.0000 mg | ORAL_TABLET | Freq: Every day | ORAL | Status: DC

## 2013-12-01 MED ORDER — MIDAZOLAM HCL 10 MG/2ML IJ SOLN
INTRAMUSCULAR | Status: DC | PRN
  Administered 2013-12-01 (×2): 2 mg via INTRAVENOUS

## 2013-12-01 MED ORDER — FENTANYL CITRATE 0.05 MG/ML IJ SOLN
INTRAMUSCULAR | Status: AC
  Filled 2013-12-01: qty 2

## 2013-12-01 MED ORDER — MIDAZOLAM HCL 5 MG/ML IJ SOLN
INTRAMUSCULAR | Status: AC
  Filled 2013-12-01: qty 2

## 2013-12-01 MED ORDER — CLONIDINE HCL 0.2 MG PO TABS
0.2000 mg | ORAL_TABLET | Freq: Every day | ORAL | Status: DC
  Administered 2013-12-01: 0.2 mg via ORAL
  Filled 2013-12-01: qty 1

## 2013-12-01 NOTE — Consult Note (Signed)
Patient interviewed and examined and chart reviewed.  Admitted with acute CVA with encephalopathy in the setting of acute hypertensive urgency.  TEE showed dilated LV with moderate LV dysfunction which is new from echo in 2009 showing normal LVF.  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  He is completely asymptomatic from a cardiac standpoint with no CP or SOB.  Agree with current meds he is on for BP control and for DCM.  Will plan for outpt Lexiscan myoview to rule out ischemia.  Would repeat echo in 3 months to reassess LVF on current heart failure regimen.

## 2013-12-01 NOTE — Progress Notes (Signed)
Stroke Team Progress Note  HISTORY Scott Richardson is a 58 y.o. male who had not been taking his BP medications or ASA for the last 9-10 months due to cost. He was brought to the hospital after he was found to be confused, having symptoms of dizziness, and "just not answering questions or being as upbeat as usual". He was brought to the ED 11/29/2013 with BP 222/91 and he was admitted. 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 Carotid dopplers were performed showing no ICA stenosis. LDL 130, A1c 6.2. Echo pending.  Wife felt the medication was not only a cost issue but both cost and non-compliance. He was last known well 11/27/2013, unable to determine time. Patient was not administerd TPA secondary to delay in arrival. He was admitted for further evaluation and treatment.  SUBJECTIVE Pt's wife present. Workup complete except trans thoracic echo. TEE - EF 30%.  OBJECTIVE Most recent Vital Signs: Filed Vitals:   12/01/13 0810 12/01/13 0815 12/01/13 0820 12/01/13 0830  BP: 200/77 191/78 154/73   Pulse: 64 58 59 55  Temp:      TempSrc:      Resp: 16 19 19 23   Height:      Weight:      SpO2: 100% 99% 99% 93%   CBG (last 3)  No results found for this basename: GLUCAP,  in the last 72 hours  IV Fluid Intake:   . sodium chloride      MEDICATIONS  . aspirin  81 mg Oral Daily  . clopidogrel  75 mg Oral Q breakfast  . enoxaparin (LOVENOX) injection  40 mg Subcutaneous Q24H  . hydrochlorothiazide  25 mg Oral Daily  . irbesartan  300 mg Oral Daily  . metoprolol succinate  50 mg Oral Daily  . simvastatin  20 mg Oral q1800   PRN:  butamben-tetracaine-benzocaine, fentaNYL, hydrALAZINE, midazolam  Diet:  NPO thin liquids Activity:  OOB with assistance DVT Prophylaxis:  Lovenox 40 mg sq daily   CLINICALLY SIGNIFICANT STUDIES Basic Metabolic Panel:   Recent Labs Lab 11/28/13 1640  NA 142  K 4.2  CL 104  CO2 25  GLUCOSE 86   BUN 13  CREATININE 1.26  CALCIUM 9.5   Liver Function Tests:   Recent Labs Lab 11/28/13 1640  AST 19  ALT 21  ALKPHOS 66  BILITOT 0.9  PROT 7.7  ALBUMIN 3.5   CBC:   Recent Labs Lab 11/28/13 1640  WBC 7.9  NEUTROABS 4.4  HGB 16.1  HCT 48.8  MCV 88.6  PLT 229   Coagulation: No results found for this basename: LABPROT, INR,  in the last 168 hours Cardiac Enzymes: No results found for this basename: CKTOTAL, CKMB, CKMBINDEX, TROPONINI,  in the last 168 hours Urinalysis:   Recent Labs Lab 11/28/13 1831  COLORURINE YELLOW  LABSPEC 1.023  PHURINE 6.0  GLUCOSEU NEGATIVE  HGBUR NEGATIVE  BILIRUBINUR NEGATIVE  KETONESUR NEGATIVE  PROTEINUR NEGATIVE  UROBILINOGEN 1.0  NITRITE NEGATIVE  LEUKOCYTESUR NEGATIVE   Lipid Panel    Component Value Date/Time   CHOL 176 11/29/2013 0537   TRIG 94 11/29/2013 0537   HDL 27* 11/29/2013 0537   CHOLHDL 6.5 11/29/2013 0537   VLDL 19 11/29/2013 0537   LDLCALC 130* 11/29/2013 0537   HgbA1C  Lab Results  Component Value Date   HGBA1C 6.2* 11/29/2013    Urine Drug Screen:     Component Value Date/Time  LABOPIA NONE DETECTED 11/28/2013 1831   COCAINSCRNUR NONE DETECTED 11/28/2013 1831   LABBENZ NONE DETECTED 11/28/2013 1831   AMPHETMU NONE DETECTED 11/28/2013 1831   THCU NONE DETECTED 11/28/2013 1831   LABBARB NONE DETECTED 11/28/2013 1831    Alcohol Level:   Recent Labs Lab 11/28/13 1640  ETH <11    CT of the brain  11/28/2013  No acute intracranial pathology.  MRI of the brain   11/29/2013  Acute nonhemorrhagic infarct involving the left lentiform nucleus extending to the left caudate.   MRA of the brain  11/29/2013     2. 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 3. 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 4. Mild narrowing of the left superior division left M2 segment. 5. High-grade stenosis  of the proximal left A1 segment.   2D Echocardiogram  - pending  TEE - No thrombus, no ASD or PFO. Ejection fraction approximately 30%.  Carotid Doppler  No evidence of hemodynamically significant internal carotid artery stenosis. Vertebral artery flow is antegrade.   EKG  ectopic atrial arrhythmia, no STEMI. For complete results please see formal report.   Therapy Recommendations no PT. OP OT  Physical Exam   Middle aged male not in distress.Awake alert. Afebrile. Head is nontraumatic. Neck is supple without bruit. Hearing is normal. Cardiac exam no murmur or gallop. Lungs are clear to auscultation. Distal pulses are well felt. Neurological Exam :  Awake alert oriented x 3 Mildly hesistant  speech  But normal language. Mild right lower face asymmetry. Tongue midline. No drift. Mild diminished fine finger movements on right. Orbits left over right upper extremity. Mild right grip weak.. Normal sensation . Normal coordination.   ASSESSMENT [name] is a 58 y.o. male presenting with confusion and dizziness. Imaging confirmed a left lenticular nucleus/caudate infarct; L M2 high-grade stenosis. Infarct felt to be embolic secondary to unknown etiology.  On aspirin 81 mg orally every day prior to admission. Now on Plavix 75 mg daily and aspirin 81 mg daily for secondary stroke prevention. Patient with resultant mild confusion per wife. Mildly hesitant speech. Stroke work up underway.  hypertension Hyperlipidemia, LDL 130, on pravachol and fish oil PTA, now on zocor 20 mg daily, goal LDL < 100  LVH  Morbid obesity, Body mass index is 42.48 kg/(m^2).   Snores loudly at night, sleepy in the am, cat-naps often per wife  Noncompliance issues with medications in part secondary to cost.  Hospital day # 3  TREATMENT/PLAN  Change to  aspirin 81 mg orally every day and clopidogrel 75 mg orally every day for secondary stroke prevention.  OP eval for obstructive sleep apnea  F/u 2D echo   TEE no PFO or ASD. EF 30% F/U Dr Pearlean BrownieSethi - 2 mos.   Delton Seeavid Rinehuls PA-C [facility] Pager [phone] 12/01/2013, 8:34 AM  I have personally obtained a history, examined the patient, evaluated imaging results, and formulated the assessment and plan of care. I agree with the above.  Delia HeadyPramod Sethi, MD

## 2013-12-01 NOTE — Discharge Instructions (Signed)
Cardiomyopathy °Cardiomyopathy means a disease of the heart muscle. The heart muscle becomes enlarged or stiff. The heart is not able to pump enough blood or deliver enough oxygen to the body. This leads to heart failure and is the number one reason for heart transplants.  °TYPES OF CARDIOMYOPATHY INCLUDE: °DILATED  °The most common type. The heart muscle is stretched out and weak so there is less blood pumped out.  °· Some causes: °· Disease of the arteries of the heart (ischemia). °· Heart attack with muscle scar. °· Leaky or damaged valves. °· After a viral illness. °· Smoking. °· High cholesterol. °· Diabetes or overactive thyroid. °· Alcohol or drug abuse. °· High blood pressure. °· May be reversible. °HYPERTROPHIC °The heart muscle grows bigger so there is less room for blood in the ventricle, and not enough blood is pumped out.  °· Causes include: °· Mitral valve leaks. °· Inherited tendency (from your family). °· No explanation (idiopathic). °· May be a cause of sudden death in young athletes with no symptoms. °RESTRICTIVE °The heart muscle becomes stiff, but not always larger. The heart has to work harder and will get weaker. Abnormal heart beats or rhythm (arrhythmia) are common. °· Some causes: °· Diseases in other parts of the body which may produce abnormal deposits in the heart muscle. °· Probably not inherited. °· A result of radiation treatment for cancer. °SYMPTOMS OF ALL TYPES: °· Less able to exercise or tolerate physical activity. °· Palpitations. °· Irregular heart beat, heart arrhythmias. °· Shortness of breath, even at rest. °· Chest pain. °· Lightheadedness or fainting. °TREATMENT °· Life-style changes including reducing salt, lowering cholesterol, stop smoking. °· Manage contributing causes with medications. °· Medicines to help reduce the fluids in the body. °· An implanted cardioverter defibrillator (ICD) to improve heart function and correct arrhythmias. °· Medications to relax the blood  vessels and make it easier for the heart to pump. °· Drugs that help regulate heart beat and improve heart relaxation, reducing the work of the heart. °· Myomectomy for patients with hypertrophic cardiomyopathy and severe problems. This is a surgical procedure that removes a portion of the thickened muscle wall in order to improve heart output and provide symptom relief. °· A heart transplant is an option in carefully applied circumstances. °SEEK IMMEDIATE MEDICAL CARE IF:  °· You have severe chest pain, especially if the pain is crushing or pressure-like and spreads to the arms, back, neck, or jaw, or if you have sweating, feeling sick to your stomach (nausea), or shortness of breath. THIS IS AN EMERGENCY. Do not wait to see if the pain will go away. Get medical help at once. Call your local emergency services (911 in [place] DO NOT drive yourself to the hospital. °· You develop severe shortness of breath. °· You begin to cough up bloody sputum. °· You are unable to sleep because you cannot breathe. °· You gain weight due to fluid retention. °· You develop painful swelling in your calf or leg. °· You feel your heart racing and it does not go away or happens when you are resting. °Document Released: 11/28/2004 Document Revised: 12/08/2011 Document Reviewed: 05/03/2008 °Exit[employer]® Patient Information ©2014 [employer]. ° °

## 2013-12-01 NOTE — Progress Notes (Signed)
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   OT comments  Pt requires supervision for BADLs due as he needs cues/prompting to initiate activities.  He demonstrates impaired working memory as well as impaired long term memory.  Recommend OPOT at discharge.    Follow Up Recommendations  Outpatient OT;Supervision/Assistance - 24 hour    Barriers to Discharge       Equipment Recommendations  None recommended by OT    Recommendations for Other Services    Frequency Min 2X/week   Progress towards OT Goals Progress towards OT goals: Progressing toward goals  Plan Discharge plan remains appropriate    Precautions / Restrictions Restrictions Weight Bearing Restrictions: No   Pertinent Vitals/Pain     ADL  Grooming: Teeth care;Supervision/safety Where Assessed - Grooming: Unsupported standing Toilet Transfer: Independent Toilet Transfer Method: Sit to stand;Stand pivot Toilet Transfer Equipment: Comfort height toilet Toileting - Clothing Manipulation and Hygiene: Independent Where Assessed - Toileting Clothing Manipulation and Hygiene: Standing ADL Comments: Pt requires cues to prompt him to perform BADLs.  Wife not present during OT session    OT Diagnosis:    OT Problem List:   OT Treatment Interventions:     OT Goals(current goals can now be found in the care plan section) ADL Goals Additional ADL Goal #1: Pt will initiate and perform BADLs routine independently  Additional ADL Goal #2: Pt will read newspaper article with no cues Additional ADL Goal #3: Pt will recall at least 3 events of day without cues Additional ADL Goal #4: wife will verbalize understanding how to cue pt for improved  cognition and safety   Visit Information  Last OT Received On: 12/01/13 Assistance Needed: +1 History of Present Illness: Patient is a 58 yo male s/p 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    Subjective Data      Prior Functioning       Cognition  Cognition Arousal/Alertness: Awake/alert Behavior During Therapy: Flat affect Overall Cognitive Status: Impaired/Different from baseline Area of Impairment: Memory;Awareness Memory: Decreased short-term memory (long term memory impairments noted) Following Commands: Follows multi-step commands consistently Safety/Judgement: Decreased awareness of deficits Awareness: Intellectual Problem Solving: Slow processing;Decreased initiation General Comments: Pt unable to recall what he ate for lunch (~30-43mins prior).  States he had a hamburger and rice.  He actually ate [place] and dressing.  Pt continues to be able to provide accurate info re: his employment or PLOF.       Mobility  Bed Mobility Overal bed mobility: Independent Transfers Overall transfer level: Independent    Exercises      Balance Balance Overall balance assessment: No apparent balance deficits (not formally assessed)  End of Session OT - End of Session Activity Tolerance: Patient tolerated treatment well Patient left: in bed;with call bell/phone within reach  GO     Kerin Cecchi M 12/01/2013, 1:20 PM

## 2013-12-01 NOTE — CV Procedure (Signed)
    Transesophageal Echocardiogram Note  Angelos Ottmar [id number] 1956-05-02  Procedure: Transesophageal Echocardiogram Indications: CVA  Procedure Details Consent: Obtained Time Out: Verified patient identification, verified procedure, site/side was marked, verified correct patient position, special equipment/implants available, Radiology Safety Procedures followed,  medications/allergies/relevent history reviewed, required imaging and test results available.  Performed  Medications: Fentanyl: 50 mcg iv Versed: 4 mg IV   Left Ventrical:  Moderate LV dysfunction,  EF 35-30%, mild LVH  Mitral Valve: mild MR  Aortic Valve: normal AV  Tricuspid Valve: normal valve, trace TR  Pulmonic Valve: normal , trivial PI  Left Atrium/ Left atrial appendage: large, normal, no thrombus  Atrial septum: intact. No ASD or PFO by color flow and by bubble study  Aorta: normal   Complications: No apparent complications Patient did tolerate procedure well.   Vesta Mixer, Montez Hageman., MD, [facility] 12/01/2013, 8:22 AM

## 2013-12-01 NOTE — Progress Notes (Signed)
Discharge instructions given. Pt verbalized understanding and all questions were answered.  

## 2013-12-01 NOTE — Evaluation (Signed)
Speech Language Pathology Evaluation Patient Details Name: Scott Richardson MRN: [id number] DOB: 1956-04-19 Today's Date: 12/01/2013 Time: 1415-1440 SLP Time Calculation (min): 25 min  Problem List:  Patient Active Problem List   Diagnosis Date Noted  . Noncompliance with medication regimen due to cost 12/01/2013  . Cardiomyopathy- 30-35% -etiology not yet determined  12/01/2013  . PVC's (premature ventricular contractions) 12/01/2013  . Abnormal EKG- LVH with repol changes 12/01/2013  . History of Hypertension with HTN CVD 12/01/2013  . Dyslipidemia 11/30/2013  . Transient global amnesia 11/28/2013  . CVA (cerebral infarction) 11/28/2013  . Hypertensive urgency 11/28/2013  . ObesityBMI 42 suspected sleep apnea 11/28/2013  . Hypertensive encephalopathy 11/28/2013   Past Medical History:  Past Medical History  Diagnosis Date  . Chest pain   . HTN (hypertension)   . Obesity   . HLD (hyperlipidemia)   . LVH (left ventricular hypertrophy)   . Stroke    Past Surgical History:  Past Surgical History  Procedure Laterality Date  . No past surgeries     HPI:  58 year old obese male with history of hypertension and hyperlipidemia on multiple blood pressure medications (has not been taking any medications for past 9 months stating difficulty affording them ) was brought to the hospital by his family after being found confused since this morning.  Patient also complained of some blurry vision and numbness of his bilateral hands today. He is not compliant with his diet and does not exercise much.  MRI revealed acute nonhemorrhagic infarct involving the left lentiform nucleus extending to the left caudate.   Assessment / Plan / Recommendation Clinical Impression  Pt. presents with mild cognitive-linguistic deficits characterized by decreased semantics during responses and explanations, vague responses, frequent "I don't know".  Conversational intelligibility reduced suspect from dialectical  differences versus impairment.  Cognition impairments noted in awareness and problem solving.  Pt. would benefit from outpatient ST services for communicative-cognitive abilities and independent performance.     SLP Assessment  All further Speech Lanaguage Pathology  needs can be addressed in the next venue of care    Follow Up Recommendations  Outpatient SLP    Frequency and Duration        Pertinent Vitals/Pain WDL    SLP Evaluation Prior Functioning  Cognitive/Linguistic Baseline: Within functional limits Type of Home: House  Lives With: Spouse Available Help at Discharge: Family Vocation:  (driver for handicapped adults)   Cognition  Overall Cognitive Status: Impaired/Different from baseline Arousal/Alertness: Awake/alert Orientation Level: Oriented X4 Attention: Sustained Sustained Attention: Appears intact Memory: Impaired Memory Impairment: Decreased short term memory;Prospective memory Decreased Short Term Memory: Verbal basic Awareness: Impaired Awareness Impairment: Anticipatory impairment;Emergent impairment Problem Solving: Impaired Problem Solving Impairment: Verbal basic Safety/Judgment: Impaired    Comprehension  Auditory Comprehension Overall Auditory Comprehension: Appears within functional limits for tasks assessed Visual Recognition/Discrimination Discrimination: Not tested Reading Comprehension Reading Status: Not tested    Expression Expression Primary Mode of Expression: Verbal Verbal Expression Overall Verbal Expression: Impaired Initiation: No impairment Level of Generative/Spontaneous Verbalization: Conversation Repetition: No impairment Naming: No impairment (difficulty in semantics in conversation) Pragmatics: No impairment Written Expression Dominant Hand: Right Written Expression: Not tested   Oral / Motor Oral Motor/Sensory Function Overall Oral Motor/Sensory Function: Impaired Labial ROM: Within Functional Limits Labial  Symmetry: Within Functional Limits Labial Strength: Within Functional Limits Lingual ROM: Reduced left Lingual Symmetry: Abnormal symmetry left Facial ROM: Within Functional Limits Facial Symmetry: Within Functional Limits Facial Strength: Within Functional Limits Motor Speech Overall Motor Speech:  Appears within functional limits for tasks assessed Respiration: Within functional limits Phonation: Normal Resonance: Within functional limits Articulation: Within functional limitis Intelligibility: Intelligibility reduced Word: 75-100% accurate Phrase: 75-100% accurate Sentence: 75-100% accurate Conversation: 75-100% accurate Motor Planning: Witnin functional limits   GO     Breck CoonsLisa Willis SL[employer] CorporationLitaker M.Ed [employer]-SLP Pager [phone]  12/01/2013

## 2013-12-01 NOTE — Progress Notes (Signed)
  Echocardiogram Echocardiogram Transesophageal has been performed.  Scott Richardson 12/01/2013, 9:37 AM

## 2013-12-01 NOTE — Consult Note (Signed)
Reason for Consult: Cardiomyopathy  Requesting Physician: Claybon Jabs  HPI: This is a 58 y.o. male with a past medical history significant for HTN and HCVD. He has never had an MI or cardiac cath. He did have an Echo in 2009 that showed moderate LVH with an EF of 65%. He has been followed by Dr Brent Bull[place] in [place]. The pt apparently has not taken any medication for 9 months due to cost. He was admitted 11/28/13 with mental status changes. He was found to be hypertensive. MRI revealed Lt brain CVA without hemorrhage. His mental status has improved but he is still not normal. He is unable to give any accurate medical history. His wife provided all medical details. A TEE was done this am that revealed LVD with an EF of 30-35%.  We are asked to see him now in consult. He denies any chest pain or SOB. He never smoked. He denies any family history of early CAD. He does have dyslipidemia and HTN though these were untreated as noted.   PMHx:  Past Medical History  Diagnosis Date  . Chest pain   . HTN (hypertension)   . Obesity   . HLD (hyperlipidemia)   . LVH (left ventricular hypertrophy)   . Stroke    Past Surgical History  Procedure Laterality Date  . No past surgeries      FAMHx: HTN   SOCHx:  reports that he has never smoked. He has never used smokeless tobacco. He reports that he does not drink alcohol or use illicit drugs.  ALLERGIES: No Known Allergies  ROS: A comprehensive review of systems was negative. See H&P for complete details.   HOME MEDICATIONS: Prescriptions prior to admission  Medication Sig Dispense Refill  . aspirin 81 MG tablet Take 81 mg by mouth daily.        . cloNIDine (CATAPRES) 0.2 MG tablet Take 0.2 mg by mouth daily.        . fish oil-omega-3 fatty acids 1000 MG capsule Take 2 g by mouth daily.        . metoprolol (TOPROL-XL) 50 MG 24 hr tablet Take 50 mg by mouth daily.        . pravastatin (PRAVACHOL) 40 MG tablet Take 40 mg by mouth daily.         . [DISCONTINUED] olmesartan-hydrochlorothiazide (BENICAR HCT) 40-12.5 MG per tablet Take 1 tablet by mouth daily.          HOSPITAL MEDICATIONS: I have reviewed the patient's current medications.  VITALS: Blood pressure 133/63, pulse 52, temperature 97.4 F (36.3 C), temperature source Oral, resp. rate 20, height 5\' 7"  (1.702 m), weight 271 lb 4.8 oz (123.061 kg), SpO2 98.00%.  PHYSICAL EXAM: General appearance: alert, cooperative, no distress and morbidly obese Neck: no carotid bruit and no JVD Lungs: clear to auscultation bilaterally Heart: regular rate and rhythm Abdomen: obese, non tender Extremities: no edema Pulses: diminnished Skin: Skin color, texture, turgor normal. No rashes or lesions Neurologic: Grossly normal  LABS: No results found for this or any previous visit (from the past 48 hour(s)).  EKG: NSR PVCs, LVH with repol changes  IMAGING: No results found.  IMPRESSION: Principal Problem:   CVA (cerebral infarction) Active Problems:   Hypertensive urgency   Hypertensive encephalopathy   Noncompliance with medication regimen due to cost   Cardiomyopathy- 30-35% -etiology not yet determined    History of Hypertension with HTN CVD   ObesityBMI 42 suspected sleep apnea   Dyslipidemia  PVC's (premature ventricular contractions)   Abnormal EKG- LVH with repol changes   RECOMMENDATION: MD to review. He will need close follow up. OP Myoview if stable at his follow up apt on 3/18.  Repeat echo in 3 months.    Time Spent Directly with Patient: 45 minutes  Abelino DerrickKILROY,Ashiyah Pavlak K [phone] beeper 12/01/2013, 3:03 PM

## 2013-12-01 NOTE — Progress Notes (Signed)
Talked to patient and spouse Scott Richardson about Outpatient therapy; [facility]; clinical information/ orders faxed/ they will contact the patient at home for start up date/ time of therapyAlexis Goodell [phone]

## 2013-12-01 NOTE — Interval H&P Note (Signed)
History and Physical Interval Note:  12/01/2013 8:04 AM  Scott Richardson  has presented today for surgery, with the diagnosis of stroke  The various methods of treatment have been discussed with the patient and family. After consideration of risks, benefits and other options for treatment, the patient has consented to  Procedure(s): TRANSESOPHAGEAL ECHOCARDIOGRAM (TEE) (N/A) as a surgical intervention .  The patient's history has been reviewed, patient examined, no change in status, stable for surgery.  I have reviewed the patient's chart and labs.  Questions were answered to the patient's satisfaction.     [name].

## 2013-12-01 NOTE — H&P (View-Only) (Signed)
Chart reviwed [facility] PROGRESS NOTE  Purvis Nesheim MRN:[id number] DOB: 11/29/1955 DOA: 11/28/2013 PCP: PERRY,LAWRENCE EDWARD, MD  Assessment/Plan: Acute CVA, likely embolic -Confirmed by MRI. Plavix  And ASA 81 mg per neuro -ECHO/Dopplers/PT/OT. -Neurology consulted TEE tomorrow Speech consult for cognitive eval  HTN Cont current  HLD on statin  Code Status: Full Code Family Communication: Wife at bedside updated on plan of care.  Disposition Plan: home after TEE   Consultants:  Neurology   Antibiotics:  None   Subjective: Improving per wife not back to baseline  Objective: Filed Vitals:   11/30/13 0545 11/30/13 1027 11/30/13 1419 11/30/13 1907  BP: 144/77 148/89 141/82 152/70  Pulse: 57 54 55 61  Temp: 97.8 F (36.6 C) 98.1 F (36.7 C) 98.1 F (36.7 C) 98.2 F (36.8 C)  TempSrc: Oral Oral Oral Oral  Resp: 20 20 20 20  Height:      Weight:      SpO2: 100% 100% 100% 98%    Intake/Output Summary (Last 24 hours) at 11/30/13 2023 Last data filed at 11/30/13 1800  Gross per 24 hour  Intake    120 ml  Output      0 ml  Net    120 ml   Filed Weights   11/28/13 2108  Weight: 123.061 kg (271 lb 4.8 oz)    Exam:   General:  awake  Cardiovascular: RRR  Respiratory: CTA B  Abdomen: S/NT/ND/+BS  Extremities: no C/C/E/+pulses  Neuro: nonfocal. Does not remember what brought him to hospitsl  Data Reviewed: Basic Metabolic Panel:  Recent Labs Lab 11/28/13 1640  NA 142  K 4.2  CL 104  CO2 25  GLUCOSE 86  BUN 13  CREATININE 1.26  CALCIUM 9.5   Liver Function Tests:  Recent Labs Lab 11/28/13 1640  AST 19  ALT 21  ALKPHOS 66  BILITOT 0.9  PROT 7.7  ALBUMIN 3.5   No results found for this basename: LIPASE, AMYLASE,  in the last 168 hours No results found for this basename: AMMONIA,  in the last 168 hours CBC:  Recent Labs Lab 11/28/13 1640  WBC 7.9  NEUTROABS 4.4  HGB 16.1  HCT 48.8  MCV 88.6  PLT 229   Cardiac  Enzymes: No results found for this basename: CKTOTAL, CKMB, CKMBINDEX, TROPONINI,  in the last 168 hours BNP (last 3 results) No results found for this basename: PROBNP,  in the last 8760 hours CBG: No results found for this basename: GLUCAP,  in the last 168 hours  No results found for this or any previous visit (from the past 240 hour(s)).   Studies: Mr Mra Head Wo Contrast  11/29/2013   CLINICAL DATA:  TIA.  Encephalopathy it weakness.  Confusion.  EXAM: MRI HEAD WITHOUT CONTRAST  MRA HEAD WITHOUT CONTRAST  TECHNIQUE: Multiplanar, multiecho pulse sequences of the brain and surrounding structures were obtained without intravenous contrast. Angiographic images of the head were obtained using MRA technique without contrast.  COMPARISON:  CT head without contrast 11/28/2013  FINDINGS: MRI HEAD FINDINGS  An acute nonhemorrhagic infarct within the left lentiform nucleus measures 3.9 x 1.5 x 2.6 cm. This extends superiorly to the body the caudate. T2 changes are associated. There is partial effacement of the left lateral ventricle compatible with mass effect. No other areas of acute infarct are evident. Minimal periventricular white matter changes are otherwise within normal limits for age.  No hemorrhage or mass lesion is present. Flow is present in the major   intracranial arteries. The globes and orbits are intact. The paranasal sinuses and mastoid air cells are clear.  MRA HEAD FINDINGS  There is a focal stenosis of the right supraclinoid internal carotid artery just beyond the posterior communicating artery. Poststenotic dilation versus fusiform aneurysm measures 2.8 mm. The ICA terminus is normal. The right A1 segment is the dominant vessel. There is a focal high-grade stenosis at the origin of the left A1 segment. The M1 segments are within normal limits bilaterally. The MCA bifurcations are within normal limits. There is mild narrowing of the superior left M2 division. Segmental narrowing is present at  the more distal MCA branch vessels and ACA branch vessels bilaterally.  The vertebral arteries are small bilaterally. There is no focal stenosis. The basilar artery is small. Fetal type posterior cerebral arteries are present bilaterally with a small right P1 segment. There is moderate attenuation of PCA branch vessels bilaterally.  IMPRESSION: 1. Acute nonhemorrhagic infarct involving the left lentiform nucleus extending to the left caudate. 2. 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 3. 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 4. Mild narrowing of the left superior division left M2 segment. 5. High-grade stenosis of the proximal left A1 segment. This corresponds with the area of infarction. Critical Value/emergent results were called by telephone at the time of interpretation on 11/29/2013 at 8:33 AM to Dr. Hernandez, who verbally acknowledged these results.   Electronically Signed   By: Chris  Mattern M.D.   On: 11/29/2013 08:34   Mr Brain Wo Contrast  11/29/2013   CLINICAL DATA:  TIA.  Encephalopathy it weakness.  Confusion.  EXAM: MRI HEAD WITHOUT CONTRAST  MRA HEAD WITHOUT CONTRAST  TECHNIQUE: Multiplanar, multiecho pulse sequences of the brain and surrounding structures were obtained without intravenous contrast. Angiographic images of the head were obtained using MRA technique without contrast.  COMPARISON:  CT head without contrast 11/28/2013  FINDINGS: MRI HEAD FINDINGS  An acute nonhemorrhagic infarct within the left lentiform nucleus measures 3.9 x 1.5 x 2.6 cm. This extends superiorly to the body the caudate. T2 changes are associated. There is partial effacement of the left lateral ventricle compatible with mass effect. No other areas of acute infarct are evident. Minimal periventricular white matter changes are otherwise within normal limits for age.  No  hemorrhage or mass lesion is present. Flow is present in the major intracranial arteries. The globes and orbits are intact. The paranasal sinuses and mastoid air cells are clear.  MRA HEAD FINDINGS  There is a focal stenosis of the right supraclinoid internal carotid artery just beyond the posterior communicating artery. Poststenotic dilation versus fusiform aneurysm measures 2.8 mm. The ICA terminus is normal. The right A1 segment is the dominant vessel. There is a focal high-grade stenosis at the origin of the left A1 segment. The M1 segments are within normal limits bilaterally. The MCA bifurcations are within normal limits. There is mild narrowing of the superior left M2 division. Segmental narrowing is present at the more distal MCA branch vessels and ACA branch vessels bilaterally.  The vertebral arteries are small bilaterally. There is no focal stenosis. The basilar artery is small. Fetal type posterior cerebral arteries are present bilaterally with a small right P1 segment. There is moderate attenuation of PCA branch vessels bilaterally.  IMPRESSION: 1. Acute nonhemorrhagic infarct involving the left lentiform nucleus extending to the left caudate. 2. Moderate stenosis within the   supra clinoid right internal carotid artery just beyond the right posterior communicating artery. 3. 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 4. Mild narrowing of the left superior division left M2 segment. 5. High-grade stenosis of the proximal left A1 segment. This corresponds with the area of infarction. Critical Value/emergent results were called by telephone at the time of interpretation on 11/29/2013 at 8:33 AM to Dr. Hernandez, who verbally acknowledged these results.   Electronically Signed   By: Chris  Mattern M.D.   On: 11/29/2013 08:34    Scheduled Meds: . [START ON 12/01/2013] aspirin  81 mg Oral Daily  . clopidogrel  75 mg Oral Q breakfast  . enoxaparin  (LOVENOX) injection  40 mg Subcutaneous Q24H  . hydrochlorothiazide  25 mg Oral Daily  . irbesartan  300 mg Oral Daily  . metoprolol succinate  50 mg Oral Daily  . simvastatin  20 mg Oral q1800   Continuous Infusions:   Time spent: 25 min  Karynn Deblasi L  [facility]  If 7PM-7AM, please contact night-coverage at [email or web address], password [email or web address] 11/30/2013, 8:23 PM  LOS: 2 days        

## 2013-12-01 NOTE — Discharge Summary (Addendum)
Physician Discharge Summary  Scott Richardson YTK:[id number] DOB: 1956/08/23 DOA: 11/28/2013  PCP: Abigail Miyamoto, MD  Admit date: 11/28/2013 Discharge date: 12/01/2013  Time spent: 35 minutes  Recommendations for Outpatient Follow-up:  1. Outpatient OSA exam 2. outpatient slp, pt  Discharge Diagnoses:  Principal Problem:   CVA (cerebral infarction) Active Problems:   Hypertensive urgency   Obesity   Other and unspecified hyperlipidemia   Discharge Condition: improved  Diet recommendation: cardiac  Filed Weights   11/28/13 2108  Weight: 123.061 kg (271 lb 4.8 oz)    History of present illness:  58 year old obese male with history of hypertension and hyperlipidemia on multiple blood pressure medications (has not been taking any medications for past 9 months stating difficulty affording them ) was brought to the hospital by his family after being found confused since this morning. His wife noticed that since this morning he was talking very little and quite confused during conversation about his orientation, the dates and people. Last evening while having supper with his daughter patient reported to her that he was feeling dizzy with the room spinning around. Patient also complained of some blurry vision and numbness of his bilateral hands today. Patient denies any head trauma or recent illness. He denies any recent travel. He is not compliant with his diet and does not exercise much.  Patient denies headache, fever, chills, nausea , vomiting, chest pain, palpitations, SOB, abdominal pain, bowel or urinary symptoms. Denies change in weight or appetite. No seizure like activity was witnessed. Denies alcohol or substance abuse.   Hospital Course:  Acute CVA, likely embolic  -Confirmed by MRI.  Plavix And ASA 81 mg per neuro  -ECHO shows no PFo but decreased EF Dopplers ok TEE ok Speech consult for cognitive eval   HTN- accelerated Cont current meds and increase as needed for  better control  HLD  -statin  Decreased EF- 30-35% -asked cardiology to see   Procedures: TEE: No ASD or PFO by color flow and by bubble study EF 30-35%   Consultations:  Neuro  cards  Discharge Exam: Filed Vitals:   12/01/13 0942  BP: 142/86  Pulse: 53  Temp: 97.9 F (36.6 C)  Resp: 22    General: pleasant Cardiovascular: rrr Respiratory: clear anterior  Discharge Instructions      Discharge Orders   Future Orders Complete By Expires   Diet - low sodium heart healthy  As directed    Discharge instructions  As directed    Comments:     Cbc, bmp 1 week   Increase activity slowly  As directed        Medication List    STOP taking these medications       olmesartan-hydrochlorothiazide 40-12.5 MG per tablet  Commonly known as:  BENICAR HCT      TAKE these medications       aspirin 81 MG tablet  Take 81 mg by mouth daily.     cloNIDine 0.2 MG tablet  Commonly known as:  CATAPRES  Take 0.2 mg by mouth daily.     clopidogrel 75 MG tablet  Commonly known as:  PLAVIX  Take 1 tablet (75 mg total) by mouth daily with breakfast.     fish oil-omega-3 fatty acids 1000 MG capsule  Take 2 g by mouth daily.     hydrochlorothiazide 25 MG tablet  Commonly known as:  HYDRODIURIL  Take 1 tablet (25 mg total) by mouth daily.     irbesartan 300 MG tablet  Commonly known as:  AVAPRO  Take 1 tablet (300 mg total) by mouth daily.     metoprolol succinate 50 MG 24 hr tablet  Commonly known as:  TOPROL-XL  Take 50 mg by mouth daily.     pravastatin 40 MG tablet  Commonly known as:  PRAVACHOL  Take 40 mg by mouth daily.       No Known Allergies    The results of significant diagnostics from this hospitalization (including imaging, microbiology, ancillary and laboratory) are listed below for reference.    Significant Diagnostic Studies: Ct Head Wo Contrast  11/28/2013   CLINICAL DATA:  Encephalopathy, weakness  EXAM: CT HEAD WITHOUT CONTRAST   TECHNIQUE: Contiguous axial images were obtained from the base of the skull through the vertex without intravenous contrast.  COMPARISON:  None.  FINDINGS: There is no evidence of mass effect, midline shift or extra-axial fluid collections. There is no evidence of a space-occupying lesion or intracranial hemorrhage. There is no evidence of a cortical-based area of acute infarction.  The ventricles and sulci are appropriate for the patient's age. The basal cisterns are patent.  Visualized portions of the orbits are unremarkable. The visualized portions of the paranasal sinuses and mastoid air cells are unremarkable.  The osseous structures are unremarkable.  IMPRESSION: No acute intracranial pathology.   Electronically Signed   By: Elige KoHetal  Patel   On: 11/28/2013 17:12   Mr Scott GlennMra Head Wo Contrast  11/29/2013   CLINICAL DATA:  TIA.  Encephalopathy it weakness.  Confusion.  EXAM: MRI HEAD WITHOUT CONTRAST  MRA HEAD WITHOUT CONTRAST  TECHNIQUE: Multiplanar, multiecho pulse sequences of the brain and surrounding structures were obtained without intravenous contrast. Angiographic images of the head were obtained using MRA technique without contrast.  COMPARISON:  CT head without contrast 11/28/2013  FINDINGS: MRI HEAD FINDINGS  An acute nonhemorrhagic infarct within the left lentiform nucleus measures 3.9 x 1.5 x 2.6 cm. This extends superiorly to the body the caudate. T2 changes are associated. There is partial effacement of the left lateral ventricle compatible with mass effect. No other areas of acute infarct are evident. Minimal periventricular white matter changes are otherwise within normal limits for age.  No hemorrhage or mass lesion is present. Flow is present in the major intracranial arteries. The globes and orbits are intact. The paranasal sinuses and mastoid air cells are clear.  MRA HEAD FINDINGS  There is a focal stenosis of the right supraclinoid internal carotid artery just beyond the posterior  communicating artery. Poststenotic dilation versus fusiform aneurysm measures 2.8 mm. The ICA terminus is normal. The right A1 segment is the dominant vessel. There is a focal high-grade stenosis at the origin of the left A1 segment. The M1 segments are within normal limits bilaterally. The MCA bifurcations are within normal limits. There is mild narrowing of the superior left M2 division. Segmental narrowing is present at the more distal MCA branch vessels and ACA branch vessels bilaterally.  The vertebral arteries are small bilaterally. There is no focal stenosis. The basilar artery is small. Fetal type posterior cerebral arteries are present bilaterally with a small right P1 segment. There is moderate attenuation of PCA branch vessels bilaterally.  IMPRESSION: 1. Acute nonhemorrhagic infarct involving the left lentiform nucleus extending to the left caudate. 2. 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 3. 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 4. Mild narrowing of  the left superior division left M2 segment. 5. High-grade stenosis of the proximal left A1 segment. This corresponds with the area of infarction. Critical Value/emergent results were called by telephone at the time of interpretation on 11/29/2013 at 8:33 AM to Dr. Ardyth Harps, who verbally acknowledged these results.   Electronically Signed   By: Gennette Pac M.D.   On: 11/29/2013 08:34   Mr Brain Wo Contrast  11/29/2013   CLINICAL DATA:  TIA.  Encephalopathy it weakness.  Confusion.  EXAM: MRI HEAD WITHOUT CONTRAST  MRA HEAD WITHOUT CONTRAST  TECHNIQUE: Multiplanar, multiecho pulse sequences of the brain and surrounding structures were obtained without intravenous contrast. Angiographic images of the head were obtained using MRA technique without contrast.  COMPARISON:  CT head without contrast 11/28/2013   FINDINGS: MRI HEAD FINDINGS  An acute nonhemorrhagic infarct within the left lentiform nucleus measures 3.9 x 1.5 x 2.6 cm. This extends superiorly to the body the caudate. T2 changes are associated. There is partial effacement of the left lateral ventricle compatible with mass effect. No other areas of acute infarct are evident. Minimal periventricular white matter changes are otherwise within normal limits for age.  No hemorrhage or mass lesion is present. Flow is present in the major intracranial arteries. The globes and orbits are intact. The paranasal sinuses and mastoid air cells are clear.  MRA HEAD FINDINGS  There is a focal stenosis of the right supraclinoid internal carotid artery just beyond the posterior communicating artery. Poststenotic dilation versus fusiform aneurysm measures 2.8 mm. The ICA terminus is normal. The right A1 segment is the dominant vessel. There is a focal high-grade stenosis at the origin of the left A1 segment. The M1 segments are within normal limits bilaterally. The MCA bifurcations are within normal limits. There is mild narrowing of the superior left M2 division. Segmental narrowing is present at the more distal MCA branch vessels and ACA branch vessels bilaterally.  The vertebral arteries are small bilaterally. There is no focal stenosis. The basilar artery is small. Fetal type posterior cerebral arteries are present bilaterally with a small right P1 segment. There is moderate attenuation of PCA branch vessels bilaterally.  IMPRESSION: 1. Acute nonhemorrhagic infarct involving the left lentiform nucleus extending to the left caudate. 2. 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 3. 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 4. Mild narrowing of the left superior division left M2 segment. 5. High-grade stenosis of the proximal left A1  segment. This corresponds with the area of infarction. Critical Value/emergent results were called by telephone at the time of interpretation on 11/29/2013 at 8:33 AM to Dr. Ardyth Harps, who verbally acknowledged these results.   Electronically Signed   By: Gennette Pac M.D.   On: 11/29/2013 08:34    Microbiology: No results found for this or any previous visit (from the past 240 hour(s)).   Labs: Basic Metabolic Panel:  Recent Labs Lab 11/28/13 1640  NA 142  K 4.2  CL 104  CO2 25  GLUCOSE 86  BUN 13  CREATININE 1.26  CALCIUM 9.5   Liver Function Tests:  Recent Labs Lab 11/28/13 1640  AST 19  ALT 21  ALKPHOS 66  BILITOT 0.9  PROT 7.7  ALBUMIN 3.5   No results found for this basename: LIPASE, AMYLASE,  in the last 168 hours No results found for this basename: AMMONIA,  in the last 168 hours CBC:  Recent Labs Lab 11/28/13 1640  WBC 7.9  NEUTROABS 4.4  HGB 16.1  HCT 48.8  MCV 88.6  PLT 229   Cardiac Enzymes: No results found for this basename: CKTOTAL, CKMB, CKMBINDEX, TROPONINI,  in the last 168 hours BNP: BNP (last 3 results) No results found for this basename: PROBNP,  in the last 8760 hours CBG: No results found for this basename: GLUCAP,  in the last 168 hours     Signed:  Marlin Canary  [facility] 12/01/2013, 10:08 AM

## 2013-12-01 NOTE — Progress Notes (Signed)
Holding discharge until seen by SLP and cardiology follow up arranged- EF has decreased from 65% to 30-35%  Marlin Canary DO

## 2013-12-05 ENCOUNTER — Encounter (HOSPITAL_COMMUNITY): Payer: Self-pay | Admitting: Cardiovascular Disease

## 2013-12-06 ENCOUNTER — Ambulatory Visit: Payer: MEDICAID | Admitting: Occupational Therapy

## 2013-12-07 ENCOUNTER — Encounter: Payer: Self-pay | Admitting: Nurse Practitioner

## 2013-12-14 ENCOUNTER — Ambulatory Visit (INDEPENDENT_AMBULATORY_CARE_PROVIDER_SITE_OTHER): Payer: Self-pay | Admitting: Nurse Practitioner

## 2013-12-14 ENCOUNTER — Other Ambulatory Visit: Payer: Self-pay | Admitting: *Deleted

## 2013-12-14 ENCOUNTER — Encounter: Payer: Self-pay | Admitting: Nurse Practitioner

## 2013-12-14 ENCOUNTER — Telehealth: Payer: Self-pay | Admitting: *Deleted

## 2013-12-14 VITALS — BP 180/80 | HR 62 | Ht 68.0 in | Wt 269.1 lb

## 2013-12-14 DIAGNOSIS — Z9119 Patient's noncompliance with other medical treatment and regimen: Secondary | ICD-10-CM

## 2013-12-14 DIAGNOSIS — I639 Cerebral infarction, unspecified: Secondary | ICD-10-CM

## 2013-12-14 DIAGNOSIS — I6350 Cerebral infarction due to unspecified occlusion or stenosis of unspecified cerebral artery: Secondary | ICD-10-CM

## 2013-12-14 DIAGNOSIS — I10 Essential (primary) hypertension: Secondary | ICD-10-CM

## 2013-12-14 DIAGNOSIS — Z91199 Patient's noncompliance with other medical treatment and regimen due to unspecified reason: Secondary | ICD-10-CM

## 2013-12-14 MED ORDER — METOPROLOL TARTRATE 25 MG PO TABS: 25.0000 mg | ORAL_TABLET | Freq: Two times a day (BID) | ORAL | Status: DC

## 2013-12-14 MED ORDER — LISINOPRIL 40 MG PO TABS: 40.0000 mg | ORAL_TABLET | Freq: Every day | ORAL | Status: DC

## 2013-12-14 MED ORDER — LEVOTHYROXINE SODIUM 88 MCG PO CAPS
88.0000 ug | ORAL_CAPSULE | Freq: Every day | ORAL | Status: AC
Start: 2013-12-14 — End: ?

## 2013-12-14 NOTE — Telephone Encounter (Signed)
S/w pt's wife stated pt is taking levothyroxine (88 mcg ) that Dr. Marina Goodell prescribed. Added medication to list

## 2013-12-14 NOTE — Progress Notes (Signed)
[name] Date of Birth: Jun 26, 1956 Medical Record #[id number]  History of Present Illness: Mr. Sleppy is seen back today for a post hospital visit. Seen for Dr. Mayford Knife. He is a 58 year old male with HTN, HLD, and obesity.   Past echo from 2009 showed an EF of 65%. Stopped all of his BP medicines for at least 9 months (due to cost)- presented with acute stroke with associated encephalopathy in the setting of hypertensive crisis earlier this month. TEE showed EF down to 30 to 35%. 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 His BP meds were restarted. Referred for outpatient Myoview. Plan for repeat echo in 3 months.  Comes back today. Here with his wife. Has not taken his medicines today. Still with memory issues. Not checking his BP at home. Tries to not use salt. No chest pain. Not short of breath. No insurance. Cost still a big issue with his care. Apparently on another medicine that we do not have recorded - wife to call back.    Current Outpatient Prescriptions  Medication Sig Dispense Refill  . aspirin 81 MG tablet Take 81 mg by mouth daily.        . cloNIDine (CATAPRES) 0.2 MG tablet Take 0.2 mg by mouth daily.        . clopidogrel (PLAVIX) 75 MG tablet Take 1 tablet (75 mg total) by mouth daily with breakfast.  30 tablet  0  . fish oil-omega-3 fatty acids 1000 MG capsule Take 2 g by mouth daily.        . hydrochlorothiazide (HYDRODIURIL) 25 MG tablet Take 1 tablet (25 mg total) by mouth daily.  30 tablet  0  . irbesartan (AVAPRO) 300 MG tablet Take 1 tablet (300 mg total) by mouth daily.  30 tablet  0  . metoprolol (TOPROL-XL) 50 MG 24 hr tablet Take 50 mg by mouth daily.        . pravastatin (PRAVACHOL) 40 MG tablet Take 40 mg by mouth daily.         No current facility-administered medications for this visit.    No Known Allergies  Past Medical History  Diagnosis Date  . Chest pain   . HTN (hypertension)   . Obesity   . HLD (hyperlipidemia)   . LVH (left ventricular  hypertrophy)   . Stroke   . Noncompliance     Past Surgical History  Procedure Laterality Date  . No past surgeries    . Tee without cardioversion N/A 12/01/2013    Procedure: TRANSESOPHAGEAL ECHOCARDIOGRAM (TEE);  Surgeon: Vesta Mixer, MD;  Location: [facility] ENDOSCOPY;  Service: Cardiovascular;  Laterality: N/A;    History  Smoking status  . Never Smoker   Smokeless tobacco  . Never Used    History  Alcohol Use No    Family History  Problem Relation Age of Onset  . Hypertension Mother   . Hypertension Mother   . Hypertension Father     Review of Systems: The review of systems is per the HPI.  All other systems were reviewed and are negative.  Physical Exam: BP 180/80  Pulse 62  Ht 5\' 8"  (1.727 m)  Wt 269 lb 1.9 oz (122.072 kg)  BMI 40.93 kg/m2  SpO2 96% Patient is alert and in no acute distress. He is obese. Skin is warm and dry. Color is normal.  HEENT is unremarkable. Normocephalic/atraumatic. PERRL. Sclera are nonicteric. Neck is supple. No masses. No JVD. Lungs are clear. Cardiac exam shows a regular  rate and rhythm. Abdomen is soft. Extremities are without edema. Gait and ROM are intact. No gross neurologic deficits noted.  LABORATORY DATA: Lab Results  Component Value Date   WBC 7.9 11/28/2013   HGB 16.1 11/28/2013   HCT 48.8 11/28/2013   PLT 229 11/28/2013   GLUCOSE 86 11/28/2013   CHOL 176 11/29/2013   TRIG 94 11/29/2013   HDL 27* 11/29/2013   LDLCALC 130* 11/29/2013   ALT 21 11/28/2013   AST 19 11/28/2013   NA 142 11/28/2013   K 4.2 11/28/2013   CL 104 11/28/2013   CREATININE 1.26 11/28/2013   BUN 13 11/28/2013   CO2 25 11/28/2013   HGBA1C 6.2* 11/29/2013   Procedure: Transesophageal Echocardiogram  Indications: CVA  Procedure Details  Consent: Obtained  Time Out: Verified patient identification, verified procedure, site/side was marked, verified correct patient position, special equipment/implants available, Radiology Safety Procedures followed, medications/allergies/relevent  history reviewed, required imaging and test results available. Performed  Medications:  Fentanyl: 50 mcg iv  Versed: 4 mg IV  Left Ventrical: Moderate LV dysfunction, EF 35-30%, mild LVH  Mitral Valve: mild MR  Aortic Valve: normal AV  Tricuspid Valve: normal valve, trace TR  Pulmonic Valve: normal , trivial PI  Left Atrium/ Left atrial appendage: large, normal, no thrombus  Atrial septum: intact. No ASD or PFO by color flow and by bubble study  Aorta: normal  Complications: No apparent complications  Patient did tolerate procedure well.  Vesta MixerPhilip J. Nahser, Montez HagemanJr., MD, [facility]  12/01/2013, 8:22 AM  MRI/MRA HEAD IMPRESSION: 1. Acute nonhemorrhagic infarct involving the left lentiform nucleus extending to the left caudate. 2. 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 3. 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 4. Mild narrowing of the left superior division left M2 segment. 5. High-grade stenosis of the proximal left A1 segment. This corresponds with the area of infarction. Critical Value/emergent results were called by telephone at the time of interpretation on 11/29/2013 at 8:33 AM to Dr. Ardyth HarpsHernandez, who verbally acknowledged these results.   Electronically Signed By: Gennette Pachris Mattern M.D. On: 11/29/2013 08:34        Assessment / Plan: 1. S/P acute stroke - residual memory issues - acute event due to uncontrolled HTN. Remains on Plavix & Aspirin - needs post hospital visit with neurology. Advised to not drive.   2. HTN - not controlled - no medicines today. Not able to afford the Toprol or Avapro - will change to Lopressor 25 mg BID and Lisinopril 40 mg a day. Wife to call back with correct list  3. Obesity  4. LV dysfunction   Will hold on stress testing. Plan for repeat echo in 3 months. See him back in 2 weeks.   Patient is  agreeable to this plan and will call if any problems develop in the interim.   Rosalio MacadamiaLori C. Kenetra Hildenbrand, RN, ANP-C [facility] [address] [place], [place]  [id number] [phone]

## 2013-12-14 NOTE — Patient Instructions (Addendum)
Monitor the blood pressure at home  Restrict salt  Stop the Toprol (metoprolol)  Stop the Avapro (ibesartan)  Start Lopressor 25 mg two times a day - this is at the drug store  Start Lisinopril 40 mg a day - this is at the drug store  See me in 2 weeks  Check labs on return  Call us with a correct medicine list if needed  Needs a post hospital visit with Dr. Pearlean Brownie at [facility]  No driving  Call the [facility] office at [phone] if you have any questions, problems or concerns.

## 2013-12-15 ENCOUNTER — Telehealth: Payer: Self-pay | Admitting: Neurology

## 2013-12-15 NOTE — Telephone Encounter (Signed)
Called pt and spoke with pt's wife Octavio Graves to make an appt on 02/06/14 with [name], NP at 3:30 pm. I advised the pt's wife that if the pt has any other problems, questions or concerns to call the office. Pt's wife verbalized understanding.

## 2013-12-16 ENCOUNTER — Telehealth: Payer: Self-pay | Admitting: Nurse Practitioner

## 2013-12-16 NOTE — Telephone Encounter (Signed)
New Prob    Wife has some questions regarding the Metoprolol medication pt was prescribed. Please call.

## 2013-12-16 NOTE — Telephone Encounter (Signed)
Wife had questioned why metoprolol was re prescribed. The difference between succinate and tartrate was explained and was less expensive. Pt will finish his toprol first then start the other. Encouraged them to call with further questions or concerns.

## 2013-12-16 NOTE — Telephone Encounter (Signed)
Pt was called back, was put on schedule closing encounter

## 2013-12-27 ENCOUNTER — Ambulatory Visit: Payer: Self-pay | Attending: Legal Medicine | Admitting: Speech Pathology

## 2013-12-27 ENCOUNTER — Ambulatory Visit: Payer: Self-pay | Admitting: *Deleted

## 2013-12-27 DIAGNOSIS — R41841 Cognitive communication deficit: Secondary | ICD-10-CM | POA: Insufficient documentation

## 2013-12-27 DIAGNOSIS — R5381 Other malaise: Secondary | ICD-10-CM | POA: Insufficient documentation

## 2013-12-27 DIAGNOSIS — I69919 Unspecified symptoms and signs involving cognitive functions following unspecified cerebrovascular disease: Secondary | ICD-10-CM | POA: Insufficient documentation

## 2013-12-29 ENCOUNTER — Encounter: Payer: Self-pay | Admitting: Nurse Practitioner

## 2013-12-29 ENCOUNTER — Ambulatory Visit (INDEPENDENT_AMBULATORY_CARE_PROVIDER_SITE_OTHER): Payer: Self-pay | Admitting: Nurse Practitioner

## 2013-12-29 VITALS — BP 128/68 | HR 52 | Ht 68.0 in | Wt 270.1 lb

## 2013-12-29 DIAGNOSIS — I428 Other cardiomyopathies: Secondary | ICD-10-CM

## 2013-12-29 DIAGNOSIS — I420 Dilated cardiomyopathy: Secondary | ICD-10-CM

## 2013-12-29 LAB — BASIC METABOLIC PANEL
BUN: 19 mg/dL (ref 6–23)
CO2: 29 mEq/L (ref 19–32)
Calcium: 9.5 mg/dL (ref 8.4–10.5)
Chloride: 99 mEq/L (ref 96–112)
Creatinine, Ser: 1.1 mg/dL (ref 0.4–1.5)
GFR: 86.77 mL/min (ref >60.00)
Glucose, Bld: 173 mg/dL — ABNORMAL HIGH (ref 70–99)
Potassium: 4.2 mEq/L (ref 3.5–5.1)
Sodium: 136 mEq/L (ref 135–145)

## 2013-12-29 NOTE — Patient Instructions (Signed)
Continue with your current medicines  Plan for repeat echocardiogram after June 5th  Follow up with Dr. Mayford Knife after echo  We will check lab today  Monitor BP at home - Omron is a good brand - keep a diary and bring in for review.  Call the [facility] office at [phone] if you have any questions, problems or concerns.

## 2013-12-29 NOTE — Progress Notes (Signed)
Scott Richardson Date of Birth: September 06, 1956 Medical Record #[id number]  History of Present Illness: Scott Richardson comes back today for a 2 week check. Seen for Dr. Mayford Knife. He has HTN, HLD and obesity.   Past echo from 2009 showed an EF of 65%. Stopped all of his BP medicines for at least 9 months (due to cost)- presented with acute stroke with associated encephalopathy in the setting of hypertensive crisis in March of 2015. TEE showed EF down to 30 to 35%. 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 His BP meds were restarted. Referred for outpatient Myoview. Plan for repeat echo in 3 months.   Seen back 2 weeks ago - had had no medicines at that visit. Was not going to be able to afford the medicines he was put back on. Changed to Lopressor and Lisinopril. No insurance. Plan to hold on stress testing for now. Plan for repeat echo in June. Arranged post hospital visit with neurology. Was advised to not drive.   Comes back today. Here with his wife. Doing well. Still with some memory issues but wife notes improvement. BP has improved. No chest pain. No shortness of breath. No swelling. No cough. No chest pain. Does note some left forearm numbness when he put his arm up on the console. Overall he is pleased with how he is doing.    Current Outpatient Prescriptions  Medication Sig Dispense Refill  . aspirin 81 MG tablet Take 81 mg by mouth daily.        . cloNIDine (CATAPRES) 0.2 MG tablet Take 0.2 mg by mouth daily.        . clopidogrel (PLAVIX) 75 MG tablet Take 1 tablet (75 mg total) by mouth daily with breakfast.  30 tablet  0  . fish oil-omega-3 fatty acids 1000 MG capsule Take 2 g by mouth daily.        . hydrochlorothiazide (HYDRODIURIL) 25 MG tablet Take 1 tablet (25 mg total) by mouth daily.  30 tablet  0  . Levothyroxine Sodium 88 MCG CAPS Take 1 capsule (88 mcg total) by mouth daily before breakfast.  30 capsule  0  . lisinopril (PRINIVIL,ZESTRIL) 40 MG tablet Take 1 tablet (40 mg total) by mouth  daily.  90 tablet  3  . metoprolol tartrate (LOPRESSOR) 25 MG tablet Take 1 tablet (25 mg total) by mouth 2 (two) times daily.  180 tablet  3  . pravastatin (PRAVACHOL) 40 MG tablet Take 40 mg by mouth daily.         No current facility-administered medications for this visit.    No Known Allergies  Past Medical History  Diagnosis Date  . Chest pain   . HTN (hypertension)   . Obesity   . HLD (hyperlipidemia)   . LVH (left ventricular hypertrophy)   . Stroke   . Noncompliance     Past Surgical History  Procedure Laterality Date  . No past surgeries    . Tee without cardioversion N/A 12/01/2013    Procedure: TRANSESOPHAGEAL ECHOCARDIOGRAM (TEE);  Surgeon: Vesta Mixer, MD;  Location: [facility] ENDOSCOPY;  Service: Cardiovascular;  Laterality: N/A;    History  Smoking status  . Never Smoker   Smokeless tobacco  . Never Used    History  Alcohol Use No    Family History  Problem Relation Age of Onset  . Hypertension Mother   . Hypertension Mother   . Hypertension Father     Review of Systems: The review of systems is per  the HPI.  All other systems were reviewed and are negative.  Physical Exam: BP 128/68  Pulse 52  Ht 5\' 8"  (1.727 m)  Wt 270 lb 1.9 oz (122.526 kg)  BMI 41.08 kg/m2  SpO2 100% Patient is very pleasant and in no acute distress. Skin is warm and dry. Color is normal.  HEENT is unremarkable. Normocephalic/atraumatic. PERRL. Sclera are nonicteric. Neck is supple. No masses. No JVD. Lungs are clear. Cardiac exam shows a regular rate and rhythm. Abdomen is soft. Extremities are without edema. Gait and ROM are intact. No gross neurologic deficits noted.  Wt Readings from Last 3 Encounters:  12/29/13 270 lb 1.9 oz (122.526 kg)  12/14/13 269 lb 1.9 oz (122.072 kg)  11/28/13 271 lb 4.8 oz (123.061 kg)     LABORATORY DATA:  Lab Results  Component Value Date   WBC 7.9 11/28/2013   HGB 16.1 11/28/2013   HCT 48.8 11/28/2013   PLT 229 11/28/2013   GLUCOSE 86  11/28/2013   CHOL 176 11/29/2013   TRIG 94 11/29/2013   HDL 27* 11/29/2013   LDLCALC 130* 11/29/2013   ALT 21 11/28/2013   AST 19 11/28/2013   NA 142 11/28/2013   K 4.2 11/28/2013   CL 104 11/28/2013   CREATININE 1.26 11/28/2013   BUN 13 11/28/2013   CO2 25 11/28/2013   HGBA1C 6.2* 11/29/2013   Procedure: Transesophageal Echocardiogram  Indications: CVA  Procedure Details  Consent: Obtained  Time Out: Verified patient identification, verified procedure, site/side was marked, verified correct patient position, special equipment/implants available, Radiology Safety Procedures followed, medications/allergies/relevent history reviewed, required imaging and test results available. Performed  Medications:  Fentanyl: 50 mcg iv  Versed: 4 mg IV  Left Ventrical: Moderate LV dysfunction, EF 35-30%, mild LVH  Mitral Valve: mild MR  Aortic Valve: normal AV  Tricuspid Valve: normal valve, trace TR  Pulmonic Valve: normal , trivial PI  Left Atrium/ Left atrial appendage: large, normal, no thrombus  Atrial septum: intact. No ASD or PFO by color flow and by bubble study  Aorta: normal  Complications: No apparent complications  Patient did tolerate procedure well.  Vesta MixerPhilip J. Richardson, Montez HagemanJr., MD, [facility]  12/01/2013, 8:22 AM    MRI/MRA HEAD IMPRESSION: 1. Acute nonhemorrhagic infarct involving the left lentiform nucleus extending to the left caudate. 2. 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 3. 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 4. Mild narrowing of the left superior division left M2 segment. 5. High-grade stenosis of the proximal left A1 segment. This corresponds with the area of infarction. Critical Value/emergent results were called by telephone at the time of interpretation on 11/29/2013 at 8:33 AM to Dr. Ardyth HarpsHernandez, who verbally acknowledged these  results.   Electronically Signed By: Gennette Pachris Mattern M.D. On: 11/29/2013 08:34      Assessment / Plan:  1. S/P acute stroke - residual memory issues but improving. Felt that the acute event due to uncontrolled HTN. Sees neuro in May.  2. HTN - BP 140/70 by me. I have left him on his current regimen. Check BMET today.   3. Obesity   4. LV dysfunction - for repeat echo after June 5th.  Follow up with Dr. Mayford Knifeurner after echo. No change in medicines.   Patient is agreeable to this plan and will call if any problems develop in the interim.   Rosalio MacadamiaLori C. Khasir Woodrome, RN, ANP-C  [facility]  [address]  [place], [place] [address]  [phone]

## 2014-01-03 ENCOUNTER — Ambulatory Visit: Payer: Self-pay | Attending: Legal Medicine

## 2014-01-03 ENCOUNTER — Ambulatory Visit: Payer: Self-pay

## 2014-01-03 DIAGNOSIS — R5381 Other malaise: Secondary | ICD-10-CM | POA: Insufficient documentation

## 2014-01-03 DIAGNOSIS — I69919 Unspecified symptoms and signs involving cognitive functions following unspecified cerebrovascular disease: Secondary | ICD-10-CM | POA: Insufficient documentation

## 2014-01-03 DIAGNOSIS — R41841 Cognitive communication deficit: Secondary | ICD-10-CM | POA: Insufficient documentation

## 2014-01-06 ENCOUNTER — Ambulatory Visit: Payer: Self-pay

## 2014-01-10 ENCOUNTER — Ambulatory Visit: Payer: Self-pay

## 2014-01-10 ENCOUNTER — Ambulatory Visit: Payer: Self-pay | Admitting: Occupational Therapy

## 2014-01-13 ENCOUNTER — Ambulatory Visit: Payer: Self-pay | Admitting: *Deleted

## 2014-01-13 ENCOUNTER — Ambulatory Visit: Payer: Self-pay

## 2014-01-17 ENCOUNTER — Ambulatory Visit: Payer: Self-pay | Admitting: Speech Pathology

## 2014-01-17 ENCOUNTER — Ambulatory Visit: Payer: Self-pay | Admitting: Occupational Therapy

## 2014-01-19 ENCOUNTER — Ambulatory Visit: Payer: Self-pay | Admitting: Speech Pathology

## 2014-01-19 ENCOUNTER — Ambulatory Visit: Payer: Self-pay | Admitting: *Deleted

## 2014-01-23 ENCOUNTER — Ambulatory Visit: Payer: Self-pay

## 2014-01-23 ENCOUNTER — Ambulatory Visit: Payer: Self-pay | Admitting: *Deleted

## 2014-01-25 ENCOUNTER — Ambulatory Visit: Payer: Self-pay | Admitting: *Deleted

## 2014-01-25 ENCOUNTER — Ambulatory Visit: Payer: Self-pay

## 2014-01-30 ENCOUNTER — Ambulatory Visit: Payer: Self-pay | Attending: Legal Medicine

## 2014-01-30 DIAGNOSIS — R41841 Cognitive communication deficit: Secondary | ICD-10-CM | POA: Insufficient documentation

## 2014-01-30 DIAGNOSIS — I69919 Unspecified symptoms and signs involving cognitive functions following unspecified cerebrovascular disease: Secondary | ICD-10-CM | POA: Insufficient documentation

## 2014-01-30 DIAGNOSIS — R5381 Other malaise: Secondary | ICD-10-CM | POA: Insufficient documentation

## 2014-02-01 ENCOUNTER — Encounter: Payer: Self-pay | Admitting: *Deleted

## 2014-02-03 ENCOUNTER — Encounter: Payer: Self-pay | Admitting: *Deleted

## 2014-02-06 ENCOUNTER — Ambulatory Visit: Payer: Self-pay | Admitting: Occupational Therapy

## 2014-02-06 ENCOUNTER — Ambulatory Visit: Payer: Self-pay

## 2014-02-06 ENCOUNTER — Ambulatory Visit: Payer: Self-pay | Admitting: Nurse Practitioner

## 2014-02-10 ENCOUNTER — Ambulatory Visit: Payer: Self-pay | Admitting: Occupational Therapy

## 2014-02-10 ENCOUNTER — Ambulatory Visit: Payer: Self-pay

## 2014-02-14 ENCOUNTER — Ambulatory Visit: Payer: Self-pay

## 2014-02-14 ENCOUNTER — Ambulatory Visit: Payer: Self-pay | Admitting: Occupational Therapy

## 2014-02-15 ENCOUNTER — Ambulatory Visit: Payer: Self-pay

## 2014-02-15 ENCOUNTER — Ambulatory Visit: Payer: Self-pay | Admitting: Occupational Therapy

## 2014-02-17 ENCOUNTER — Encounter: Payer: Self-pay | Admitting: Occupational Therapy

## 2014-02-21 ENCOUNTER — Ambulatory Visit: Payer: Self-pay

## 2014-02-21 ENCOUNTER — Ambulatory Visit: Payer: Self-pay | Admitting: Occupational Therapy

## 2014-02-23 ENCOUNTER — Encounter: Payer: Self-pay | Admitting: Occupational Therapy

## 2014-03-03 ENCOUNTER — Emergency Department (HOSPITAL_COMMUNITY): Payer: Self-pay

## 2014-03-03 ENCOUNTER — Encounter (HOSPITAL_COMMUNITY): Payer: Self-pay | Admitting: Emergency Medicine

## 2014-03-03 ENCOUNTER — Emergency Department (HOSPITAL_COMMUNITY)
Admission: EM | Admit: 2014-03-03 | Discharge: 2014-03-03 | Disposition: A | Discharge status: Home / Self Care | Payer: Self-pay | Attending: Emergency Medicine | Admitting: Emergency Medicine

## 2014-03-03 DIAGNOSIS — I10 Essential (primary) hypertension: Secondary | ICD-10-CM | POA: Insufficient documentation

## 2014-03-03 DIAGNOSIS — Z79899 Other long term (current) drug therapy: Secondary | ICD-10-CM | POA: Insufficient documentation

## 2014-03-03 DIAGNOSIS — G459 Transient cerebral ischemic attack, unspecified: Secondary | ICD-10-CM | POA: Insufficient documentation

## 2014-03-03 DIAGNOSIS — Z9119 Patient's noncompliance with other medical treatment and regimen: Secondary | ICD-10-CM | POA: Insufficient documentation

## 2014-03-03 DIAGNOSIS — R4789 Other speech disturbances: Secondary | ICD-10-CM | POA: Insufficient documentation

## 2014-03-03 DIAGNOSIS — Z7902 Long term (current) use of antithrombotics/antiplatelets: Secondary | ICD-10-CM | POA: Insufficient documentation

## 2014-03-03 DIAGNOSIS — R209 Unspecified disturbances of skin sensation: Secondary | ICD-10-CM | POA: Insufficient documentation

## 2014-03-03 DIAGNOSIS — E785 Hyperlipidemia, unspecified: Secondary | ICD-10-CM | POA: Insufficient documentation

## 2014-03-03 DIAGNOSIS — Z91199 Patient's noncompliance with other medical treatment and regimen due to unspecified reason: Secondary | ICD-10-CM | POA: Insufficient documentation

## 2014-03-03 DIAGNOSIS — E669 Obesity, unspecified: Secondary | ICD-10-CM | POA: Insufficient documentation

## 2014-03-03 DIAGNOSIS — Z7982 Long term (current) use of aspirin: Secondary | ICD-10-CM | POA: Insufficient documentation

## 2014-03-03 LAB — CBC WITH DIFFERENTIAL/PLATELET
Basophils Absolute: 0 10*3/uL (ref 0.0–0.1)
Basophils Relative: 0 % (ref 0–1)
EOS ABS: 0 10*3/uL (ref 0.0–0.7)
EOS PCT: 0 % (ref 0–5)
HCT: 46 % (ref 39.0–52.0)
HEMOGLOBIN: 15.8 g/dL (ref 13.0–17.0)
Lymphocytes Relative: 29 % (ref 12–46)
Lymphs Abs: 2.3 10*3/uL (ref 0.7–4.0)
MCH: 30 pg (ref 26.0–34.0)
MCHC: 34.3 g/dL (ref 30.0–36.0)
MCV: 87.3 fL (ref 78.0–100.0)
MONOS PCT: 9 % (ref 3–12)
Monocytes Absolute: 0.7 10*3/uL (ref 0.1–1.0)
Neutro Abs: 4.9 10*3/uL (ref 1.7–7.7)
Neutrophils Relative %: 62 % (ref 43–77)
Platelets: 227 10*3/uL (ref 150–400)
RBC: 5.27 MIL/uL (ref 4.22–5.81)
RDW: 13.4 % (ref 11.5–15.5)
WBC: 7.9 10*3/uL (ref 4.0–10.5)

## 2014-03-03 LAB — BASIC METABOLIC PANEL
BUN: 12 mg/dL (ref 6–23)
CHLORIDE: 103 meq/L (ref 96–112)
CO2: 24 mEq/L (ref 19–32)
CREATININE: 1 mg/dL (ref 0.50–1.35)
Calcium: 9.8 mg/dL (ref 8.4–10.5)
GFR calc Af Amer: >90 mL/min (ref >90)
GFR calc non Af Amer: 82 mL/min — ABNORMAL LOW (ref >90)
Glucose, Bld: 115 mg/dL — ABNORMAL HIGH (ref 70–99)
Potassium: 3.7 mEq/L (ref 3.7–5.3)
Sodium: 141 mEq/L (ref 137–147)

## 2014-03-03 LAB — TROPONIN I: Troponin I: <0.3 ng/mL (ref <0.30)

## 2014-03-03 LAB — PROTIME-INR
INR: 1.01 (ref 0.00–1.49)
Prothrombin Time: 13.1 seconds (ref 11.6–15.2)

## 2014-03-03 LAB — URINALYSIS, ROUTINE W REFLEX MICROSCOPIC
Bilirubin Urine: NEGATIVE
Glucose, UA: NEGATIVE mg/dL
Hgb urine dipstick: NEGATIVE
Ketones, ur: NEGATIVE mg/dL
LEUKOCYTES UA: NEGATIVE
Nitrite: NEGATIVE
PH: 5.5 (ref 5.0–8.0)
PROTEIN: NEGATIVE mg/dL
Specific Gravity, Urine: 1.021 (ref 1.005–1.030)
Urobilinogen, UA: 1 mg/dL (ref 0.0–1.0)

## 2014-03-03 NOTE — ED Notes (Signed)
Patient returned from MRI.

## 2014-03-03 NOTE — ED Notes (Signed)
Anyelo MD at bedside. 

## 2014-03-03 NOTE — ED Notes (Signed)
Hx of stroke 3 months ago. Pt and wife reports she noticed his speech changed around 1100 this am. Pt wife reports right sided lip droop starting around 1615. Pt reports right hand feels heavier. Denies pain. md immediately made aware.

## 2014-03-03 NOTE — ED Notes (Signed)
Pt transferred to MRI

## 2014-03-03 NOTE — ED Notes (Addendum)
Pt reports intermittent right arm heaviness. Pt denies heaviness at present time.   Pt hx of stroke 3 months ago. Wife reports change in speech at 1100 this am and reports daughter noticing right sided facial droop at 1615 today.

## 2014-03-03 NOTE — Discharge Instructions (Signed)
No changes in your medications. Return if any recurrence of symptoms.  Transient Ischemic Attack A transient ischemic attack (TIA) is a "warning stroke" that causes stroke-like symptoms. A TIA does not cause lasting damage to the brain. It is important to know when to get help and what to do to prevent stroke or death.  HOME CARE   Take all medicines exactly as told by your doctor. Understand all your medicine instructions.  You may need to take aspirin or warfarin medicine. Take warfarin exactly as told.  Taking too much or too little warfarin is dangerous. Blood tests must be done as often as told by your doctor. These blood tests help your doctor make sure the amount of warfarin you are taking is right. A PT blood test measures how long it takes for blood to clot. Your PT is used to calculate another value called an INR. Your PT and INR help your doctor adjust your warfarin dosage.  Food can cause problems with warfarin and affect the results of your blood tests. This is true for foods high in vitamin K. Spinach, kale, broccoli, cabbage, collard and turnip greens, brussels sprouts, peas, cauliflower, seaweed, and parsley are high in vitamin K as well as beef and pork liver, green tea, and soybean oil. Eat the same amount of food high in vitamin K. Avoid major changes in your diet. Tell your doctor before changing your diet. Talk to a food specialist (dietitian) if you have questions.  Many medicines can cause problems with warfarin and affect your PT and INR. Tell your doctor about all medicines you take. This includes vitamins and dietary pills (supplements). Be careful with aspirin and medicines that relieve redness, soreness, and puffiness (inflammation). Do not take or stop medicines unless your doctor tells you to.  Warfarin can cause a lot of bruising or bleeding. Hold pressure over cuts for longer than normal. Talk to your doctor about other side effects of warfarin.  Avoid sports or  activities that may cause injury or bleeding.  Be careful when you shave, floss your teeth, or use sharp objects.  Avoid alcoholic drinks or drink very little alcohol while taking warfarin. Tell your doctor if you change how much alcohol you drink.  Tell your dentist and other doctors that you take warfarin before procedures.  Eat 5 or more servings of fruits and vegetables a day.  Follow your diet program as told, if you are given one.  Keep a healthy weight.  Stay active. Try to get at least 30 minutes of activity on most or all days.  Do not smoke.  Limit how much alcohol you drink even if you are not taking warfarin. Moderate alcohol use is:  No more than 2 drinks each day for men.  No more than 1 drink each day for women who are not pregnant.  Stop abusing drugs.  Keep your home safe so you do not fall. Try:  Putting grab bars in the bedroom and bathroom.  Raising toilet seats.  Putting a seat in the shower.  Keep all doctor visits a told. GET HELP IF:  Your personality changes.  You have trouble swallowing.  You are seeing two of everything.  You are dizzy.  You have a fever.  Your skin starts to break down. GET HELP RIGHT AWAY IF:  The symptoms below may be a sign of an emergency. Do not wait to see if the symptoms go away. Call for help (911 in [place] Do not drive  yourself to the hospital.  You have sudden weakness or numbness on the face, arm, or leg (especially on one side of the body).  You have sudden trouble walking or moving your arms or legs.  You have sudden confusion.  You have trouble talking or understanding.  You have sudden trouble seeing in one or both eyes.  You lose your balance or your movements are not smooth.  You have a sudden, severe headache with no known cause.  You have new chest pain or you feel your heart beating in a unsteady way.  You are partly or totally unaware of what is going on around you. MAKE SURE YOU:    Understand these instructions.  Will watch your condition.  Will get help right away if you are not doing well or get worse. Document Released: 06/24/2008 Document Revised: 09/01/2012 Document Reviewed: 11/08/2009 [facility] Patient Information 2014 [place], [place].

## 2014-03-03 NOTE — ED Provider Notes (Signed)
CSN: [id number]     Arrival date & time 03/03/14  1710 History   First MD Initiated Contact with Patient 03/03/14 1716     Chief Complaint  Patient presents with  . Facial Droop  . hx of stroke in march 2015       HPI  Vision presents with complaint of difficulty with speech and right facial droop.  Patient's wife noticed his face drooping about 9:00 this morning. She states that his voice was weak he was speaking last. Cannot really describe dysarthria. Patient stated he noticed a feels like he has improved. However, he still feels like his right arm "has a weight on it".   He remains ambulatory. He recently presented with a similar presentation with decreased speech confusion and right facial weakness several weeks ago. MRI showed CVA. MRI showed left L. nucleus and caudate nucleus CVA.  Also high-grade stenosis at left proximal carotid 81 that correlated with his area of infarct. No rhythm disturbances. Normal PE for clot. Had greatly diminished cardiac function, EF of 35%. However no ASD or PFO. LDH consistent with his untreated hypertension.  Was hypertensive and had been noncompliant multiple medications for several months. He is compliant with medications. Is on aspirin and Plavix. Is on multiple antihypertensives and presents with a pressure of 140. Denies headache. No difficulty swallowing. No leg weakness.  Past Medical History  Diagnosis Date  . Chest pain   . HTN (hypertension)   . Obesity   . HLD (hyperlipidemia)   . LVH (left ventricular hypertrophy)   . Stroke   . Noncompliance    Past Surgical History  Procedure Laterality Date  . No past surgeries    . Tee without cardioversion N/A 12/01/2013    Procedure: TRANSESOPHAGEAL ECHOCARDIOGRAM (TEE);  Surgeon: Vesta Mixer, MD;  Location: [facility] ENDOSCOPY;  Service: Cardiovascular;  Laterality: N/A;   Family History  Problem Relation Age of Onset  . Hypertension Mother   . Hypertension Mother   . Hypertension Father     History  Substance Use Topics  . Smoking status: Never Smoker   . Smokeless tobacco: Never Used  . Alcohol Use: No    Review of Systems  Constitutional: Negative for fever, chills, diaphoresis, appetite change and fatigue.  HENT: Negative for mouth sores, sore throat and trouble swallowing.   Eyes: Negative for visual disturbance.  Respiratory: Negative for cough, chest tightness, shortness of breath and wheezing.   Cardiovascular: Negative for chest pain.  Gastrointestinal: Negative for nausea, vomiting, abdominal pain, diarrhea and abdominal distention.  Endocrine: Negative for polydipsia, polyphagia and polyuria.  Genitourinary: Negative for dysuria, frequency and hematuria.  Musculoskeletal: Negative for gait problem.  Skin: Negative for color change, pallor and rash.  Neurological: Positive for weakness and numbness. Negative for dizziness, syncope, light-headedness and headaches.       Patient describes his right arm feeling heavy "like there is a weight on it".  Hematological: Does not bruise/bleed easily.  Psychiatric/Behavioral: Negative for behavioral problems and confusion.      Allergies  Review of patient's allergies indicates no known allergies.  Home Medications   Prior to Admission medications   Medication Sig Start Date End Date Taking? Authorizing Provider  aspirin 81 MG tablet Take 81 mg by mouth daily.     Yes Historical Provider, MD  cloNIDine (CATAPRES) 0.2 MG tablet Take 0.2 mg by mouth daily.     Yes Historical Provider, MD  clopidogrel (PLAVIX) 75 MG tablet Take 1 tablet (75  mg total) by mouth daily with breakfast. 12/01/13  Yes Joseph ArtJessica U Vann, DO  fish oil-omega-3 fatty acids 1000 MG capsule Take 2 g by mouth daily.     Yes Historical Provider, MD  hydrochlorothiazide (HYDRODIURIL) 25 MG tablet Take 1 tablet (25 mg total) by mouth daily. 12/01/13  Yes Joseph ArtJessica U Vann, DO  Levothyroxine Sodium 88 MCG CAPS Take 1 capsule (88 mcg total) by mouth daily  before breakfast. 12/14/13  Yes Rosalio MacadamiaLori C Gerhardt, NP  lisinopril (PRINIVIL,ZESTRIL) 40 MG tablet Take 1 tablet (40 mg total) by mouth daily. 12/14/13  Yes Rosalio MacadamiaLori C Gerhardt, NP  metoprolol tartrate (LOPRESSOR) 25 MG tablet Take 1 tablet (25 mg total) by mouth 2 (two) times daily. 12/14/13  Yes Rosalio MacadamiaLori C Gerhardt, NP  pravastatin (PRAVACHOL) 40 MG tablet Take 40 mg by mouth daily.     Yes Historical Provider, MD   BP 122/61  Pulse 55  Temp(Src) 97.8 F (36.6 C) (Oral)  Resp 16  SpO2 99% Physical Exam  Constitutional: He is oriented to person, place, and time. He appears well-developed and well-nourished. No distress.  HENT:  Head: Normocephalic.  Eyes: Conjunctivae are normal. Pupils are equal, round, and reactive to light. No scleral icterus.  Neck: Normal range of motion. Neck supple. No thyromegaly present.  Cardiovascular: Normal rate and regular rhythm.  Exam reveals no gallop and no friction rub.   No murmur heard. Pulmonary/Chest: Effort normal and breath sounds normal. No respiratory distress. He has no wheezes. He has no rales.  Abdominal: Soft. Bowel sounds are normal. He exhibits no distension. There is no tenderness. There is no rebound.  Musculoskeletal: Normal range of motion.  Neurological: He is alert and oriented to person, place, and time.  Cranial nerves are intact. No obvious facial droop upper lower. He feels that his strength of his facial muscles normal history appear symmetric. He has no pronator drift. However he describes a heavy feeling of his right arm. Normal abortion the strength. I did not check his gait.  Skin: Skin is warm and dry. No rash noted.  Psychiatric: He has a normal mood and affect. His behavior is normal.    ED Course  Procedures (including critical care time) Labs Review Labs Reviewed  BASIC METABOLIC PANEL - Abnormal; Notable for the following:    Glucose, Bld 115 (*)    GFR calc non Af Amer 82 (*)    All other components within normal limits   CBC WITH DIFFERENTIAL  PROTIME-INR  URINALYSIS, ROUTINE W REFLEX MICROSCOPIC  TROPONIN I    Imaging Review Mr Brain Wo Contrast  03/03/2014   CLINICAL DATA:  Right facial droop and right arm heaviness.  EXAM: MRI HEAD WITHOUT CONTRAST  TECHNIQUE: Multiplanar, multiecho pulse sequences of the brain and surrounding structures were obtained without intravenous contrast.  COMPARISON:  MRI brain 11/29/2013.  FINDINGS: Focal restricted diffusion is noted lateral to the previous left basal ganglia infarct. This likely affects portions of the residual left lentiform nucleus or external capsule. The caudate head appears preserved. No acute right-sided infarct is present. No hemorrhage or mass lesion is evident. T2 hyperintensity extends into the left cerebral peduncle compatible with wallerian degeneration. Minimal white matter changes are present otherwise. The ventricles are of normal size. Flow is present in the major intracranial arteries. The globes and orbits are intact. The paranasal sinuses and mastoid air cells are clear.  IMPRESSION: 1. Extension of left basal ganglia infarct with areas of restricted diffusion along the lateral  margin of the previous infarct territory. 2. Evidence for wallerian degeneration. Item   Electronically Signed   By: Gennette Pac M.D.   On: 03/03/2014 18:37     EKG Interpretation None      MDM   Final diagnoses:  TIA (transient ischemic attack)    On reexamination. The patient is intact and asymptomatic. MRI shows slight extension of his previous CVA. On exam no complaint of weakness. No demonstrable weakness. Symmetric smile. No concerns from his wife who is sitting at his bedside for the last few hours. I reviewed the patient's history, and MRI with Dr. Cyril Mourning.  Per my description of the patient's symptoms resolve and we both felt that this was via a likely TIA. He had undergone appropriate testing recently. Remains on adequate treatment.    Rolland Porter,  MD 03/03/14 [phone]

## 2014-03-03 NOTE — ED Notes (Signed)
Pt denies pain OR symptoms at present time. Pt states "I feel normal."

## 2014-03-03 NOTE — ED Notes (Signed)
Pt encouraged to void when able for urinalysis.

## 2014-03-06 ENCOUNTER — Ambulatory Visit (HOSPITAL_COMMUNITY): Payer: Self-pay | Attending: Cardiology | Admitting: Radiology

## 2014-03-06 DIAGNOSIS — I6789 Other cerebrovascular disease: Secondary | ICD-10-CM

## 2014-03-06 DIAGNOSIS — I420 Dilated cardiomyopathy: Secondary | ICD-10-CM

## 2014-03-06 DIAGNOSIS — I428 Other cardiomyopathies: Secondary | ICD-10-CM

## 2014-03-06 DIAGNOSIS — I509 Heart failure, unspecified: Secondary | ICD-10-CM | POA: Insufficient documentation

## 2014-03-06 NOTE — Progress Notes (Signed)
Echocardiogram performed.  

## 2014-03-10 ENCOUNTER — Encounter: Payer: Self-pay | Admitting: Cardiology

## 2014-03-10 ENCOUNTER — Ambulatory Visit (INDEPENDENT_AMBULATORY_CARE_PROVIDER_SITE_OTHER): Payer: Self-pay | Admitting: Cardiology

## 2014-03-10 VITALS — BP 126/70 | HR 56 | Ht 68.0 in | Wt 264.0 lb

## 2014-03-10 DIAGNOSIS — I119 Hypertensive heart disease without heart failure: Secondary | ICD-10-CM

## 2014-03-10 DIAGNOSIS — I43 Cardiomyopathy in diseases classified elsewhere: Principal | ICD-10-CM

## 2014-03-10 DIAGNOSIS — I10 Essential (primary) hypertension: Secondary | ICD-10-CM

## 2014-03-10 DIAGNOSIS — G4710 Hypersomnia, unspecified: Secondary | ICD-10-CM | POA: Insufficient documentation

## 2014-03-10 DIAGNOSIS — I428 Other cardiomyopathies: Secondary | ICD-10-CM

## 2014-03-10 NOTE — Progress Notes (Signed)
[address] [place], [place]  [address] Phone: [phone] Fax:  [phone]  Date:  03/10/2014   ID:  Scott BankerJames Richardson, DOB January 15, 1956, MRN [id number]  PCP:  Abigail MiyamotoPERRY,LAWRENCE EDWARD, MD  Cardiologist:  Armanda Magicraci Belia Febo, MD     History of Present Illness: [name] comes back today for followup. He has HTN, HLD and obesity. His past echo from 2009 showed an EF of 65%. He stopped all of his BP medicines for at least 9 months (due to cost)- presented with acute stroke with associated encephalopathy in the setting of hypertensive crisis in March of 2015. TEE showed EF down to 30 to 35%. 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 His BP meds were restarted. Referred for outpatient Myoview. Plan for repeat echo in 3 months. Seen back a few weeks ago - had had no medicines at that visit. Was not going to be able to afford the medicines he was put back on. Changed to Lopressor and Lisinopril. No insurance. Plan to hold on stress testing for now. He was seen back in the ER with stroke like symptoms again and was evaluated by [facility].  He was sent home after neuro eval.   BP had improved. He is doing well today.  No chest pain. No shortness of breath. No swelling. No cough. Denies any palpitations.   Wt Readings from Last 3 Encounters:  03/10/14 264 lb (119.75 kg)  12/29/13 270 lb 1.9 oz (122.526 kg)  12/14/13 269 lb 1.9 oz (122.072 kg)     Past Medical History  Diagnosis Date  . Chest pain   . HTN (hypertension)   . Obesity   . HLD (hyperlipidemia)   . LVH (left ventricular hypertrophy)   . Stroke   . Noncompliance     Current Outpatient Prescriptions  Medication Sig Dispense Refill  . aspirin 81 MG tablet Take 81 mg by mouth daily.        . cloNIDine (CATAPRES) 0.2 MG tablet Take 0.2 mg by mouth daily.        . clopidogrel (PLAVIX) 75 MG tablet Take 1 tablet (75 mg total) by mouth daily with breakfast.  30 tablet  0  . fish oil-omega-3 fatty acids 1000 MG capsule Take 2 g by mouth daily.         . hydrochlorothiazide (HYDRODIURIL) 25 MG tablet Take 1 tablet (25 mg total) by mouth daily.  30 tablet  0  . Levothyroxine Sodium 88 MCG CAPS Take 1 capsule (88 mcg total) by mouth daily before breakfast.  30 capsule  0  . lisinopril (PRINIVIL,ZESTRIL) 40 MG tablet Take 1 tablet (40 mg total) by mouth daily.  90 tablet  3  . metoprolol tartrate (LOPRESSOR) 25 MG tablet Take 1 tablet (25 mg total) by mouth 2 (two) times daily.  180 tablet  3  . pravastatin (PRAVACHOL) 40 MG tablet Take 40 mg by mouth daily.         No current facility-administered medications for this visit.    Allergies:   No Known Allergies  Social History:  The patient  reports that he has never smoked. He has never used smokeless tobacco. He reports that he does not drink alcohol or use illicit drugs.   Family History:  The patient's family history includes Hypertension in his father, mother, and mother.   ROS:  Please see the history of present illness.      All other systems reviewed and negative.   PHYSICAL EXAM: VS:  BP 126/70  Pulse 56  Ht 5\' 8"  (1.727 m)  Wt 264 lb (119.75 kg)  BMI 40.15 kg/m2 Well nourished, well developed, in no acute distress HEENT: normal Neck: no JVD Cardiac:  normal S1, S2; RRR; no murmur Lungs:  clear to auscultation bilaterally, no wheezing, rhonchi or rales Abd: soft, nontender, no hepatomegaly Ext: no edema Skin: warm and dry Neuro:  CNs 2-12 intact, no focal abnormalities noted  Assessment / Plan:  1. S/P acute stroke - residual memory issues but improving. Felt that the acute event due to uncontrolled HTN. 2. HTN - controlled. I have left him on his current regimen. 3. Obesity  4. LV dysfunction secondary to poorly controlled HTN -  EF now improved to 55-60% with grade I diastolic dysfunction and mod LVH.   5.  Hypersmonia with snoring - will get a sleep study in light of his HTN  Follow up with me in 3 months      Signed, Armanda Magic, MD 03/10/2014 10:34 AM

## 2014-03-10 NOTE — Patient Instructions (Signed)
Your physician recommends that you continue on your current medications as directed. Please refer to the Current Medication list given to you today.  Your physician recommends that you schedule a follow-up appointment in: 3 month ov  Will arrange a sleep study

## 2014-04-07 ENCOUNTER — Encounter: Payer: Self-pay | Admitting: Nurse Practitioner

## 2014-04-11 ENCOUNTER — Telehealth: Payer: Self-pay | Admitting: *Deleted

## 2014-04-12 NOTE — Telephone Encounter (Signed)
Called pt and spoke with pt's wife Scott Richardson and I informed her that the pt could make an appt with Dr. Pearlean Brownie, but it's going to take the appt out a little further, than 04/18/14, wife stated that she would just keep the appt with [name], NP. I advised the wife that if the pt has any other problems, questions or concerns to call the office. Wife verbalized understanding.

## 2014-04-13 ENCOUNTER — Ambulatory Visit: Payer: Self-pay | Admitting: Nurse Practitioner

## 2014-04-18 ENCOUNTER — Ambulatory Visit (INDEPENDENT_AMBULATORY_CARE_PROVIDER_SITE_OTHER): Payer: Self-pay | Admitting: Nurse Practitioner

## 2014-04-18 ENCOUNTER — Encounter: Payer: Self-pay | Admitting: Nurse Practitioner

## 2014-04-18 VITALS — BP 132/74 | HR 52 | Temp 97.0°F | Ht 68.0 in | Wt 265.5 lb

## 2014-04-18 DIAGNOSIS — E669 Obesity, unspecified: Secondary | ICD-10-CM

## 2014-04-18 DIAGNOSIS — I639 Cerebral infarction, unspecified: Secondary | ICD-10-CM

## 2014-04-18 DIAGNOSIS — E785 Hyperlipidemia, unspecified: Secondary | ICD-10-CM

## 2014-04-18 DIAGNOSIS — I6350 Cerebral infarction due to unspecified occlusion or stenosis of unspecified cerebral artery: Secondary | ICD-10-CM

## 2014-04-18 MED ORDER — CLOPIDOGREL BISULFATE 75 MG PO TABS: 75.0000 mg | ORAL_TABLET | Freq: Every day | ORAL | Status: DC

## 2014-04-18 NOTE — Progress Notes (Signed)
I agree with the above plan 

## 2014-04-18 NOTE — Progress Notes (Signed)
PATIENT: Jennette BankerJames Borromeo DOB: 12-30-1955  REASON FOR VISIT: hospital follow up for stroke HISTORY FROM: patient and wife  HISTORY OF PRESENT ILLNESS: Jennette BankerJames Sedlar is an 58 y.o. male who comes in for his first office visit post hospital discharge for stroke.  He had not been taking his BP medications or ASA for the last 9-10 months prior due to cost. He was brought to the hospital after he was found to be confused, having symptoms of dizziness, and "just not answering questions or being a upbeat as usual". He was brought to the ED 11/29/2013 with BP 222/91 and he was admitted. 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 MRA of the brain showed moderate intracranial stenosis. Carotid dopplers were performed showing no ICA stenosis. TEE showed no thrombus, no ASD or PFO. Ejection fraction approximately 30%. LDL 130, A1c 6.2.  He was last known well 11/27/2013, unable to determine time. Patient was not administerd TPA secondary to delay in arrival. He was admitted for further evaluation and treatment. Wife feels the medication was more a non-compliance issue than a cost issue. Since stroke, wife feels like he is cognitively slower and more confused. 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  Since the stroke he has not been driving and was recommended by his PCP to file for disability which he has not done yet. Since discharge he attended outpatient speech therapy.  He and his wife states that he has been consistently taking all of his medications since discharge.  Blood pressure in the office today is 132/74. He has been scheduled for a sleep study to evaluate for obstructive sleep apnea. Wife states that since TEE his voice has been soft and hoarse, making it difficult to understand him.  REVIEW OF SYSTEMS: Full 14 system  review of systems performed and notable only for: leg swelling, numbness, speech difficulty  ALLERGIES: No Known Allergies  HOME MEDICATIONS: Outpatient Prescriptions Prior to Visit  Medication Sig Dispense Refill  . cloNIDine (CATAPRES) 0.2 MG tablet Take 0.2 mg by mouth daily.        . fish oil-omega-3 fatty acids 1000 MG capsule Take 2 g by mouth daily.        . hydrochlorothiazide (HYDRODIURIL) 25 MG tablet Take 1 tablet (25 mg total) by mouth daily.  30 tablet  0  . Levothyroxine Sodium 88 MCG CAPS Take 1 capsule (88 mcg total) by mouth daily before breakfast.  30 capsule  0  . lisinopril (PRINIVIL,ZESTRIL) 40 MG tablet Take 1 tablet (40 mg total) by mouth daily.  90 tablet  3  . metoprolol tartrate (LOPRESSOR) 25 MG tablet Take 1 tablet (25 mg total) by mouth 2 (two) times daily.  180 tablet  3  . pravastatin (PRAVACHOL) 40 MG tablet Take 40 mg by mouth daily.        Marland Kitchen. aspirin 81 MG tablet Take 81 mg by mouth daily.        . clopidogrel (PLAVIX) 75 MG tablet Take 1 tablet (75 mg total) by mouth daily with breakfast.  30 tablet  0   No facility-administered medications prior to visit.    PHYSICAL EXAM Filed Vitals:   04/18/14 1056  BP: 132/74  Pulse: 52  Temp: 97 F (36.1 C)  TempSrc: Oral  Height: 5\' 8"  (1.727  m)  Weight: 265 lb 8 oz (120.43 kg)   Body mass index is 40.38 kg/(m^2).  Visual Acuity Screening   Right eye Left eye Both eyes  Without correction: 20/70 20/70   With correction:      Generalized: Well developed, in no acute distress  Head: normocephalic and atraumatic. Oropharynx benign  Neck: Supple, no carotid bruits  Cardiac: Regular rate rhythm, no murmur  Musculoskeletal: No deformity   Neurological examination  Mentation: Alert oriented to time, place, history taking. Follows all commands. Mildly hesistant speech but normal language. Hypophonic. Cranial nerve II-XII: Fundoscopic exam not done. Pupils were equal round reactive to light extraocular  movements were full, visual field were full on confrontational test. Facial sensation  normal. Mild right lower face asymmetry. Hearing was intact to finger rubbing bilaterally. Uvula tongue midline. head turning and shoulder shrug and were normal and symmetric.Tongue protrusion into cheek strength was normal. Motor: The motor testing reveals 5 over 5 strength of all 4 extremities. Good symmetric motor tone is noted throughout. Mild diminished fine finger movements on right. Orbits left over rightt upper extremity.  Sensory: Sensory testing is intact to soft touch on all 4 extremities. No evidence of extinction is noted.  Coordination: Cerebellar testing reveals good finger-nose-finger and heel-to-shin bilaterally.  Gait and station: Gait is normal. Tandem gait is unsteady. Able to toe and heel walk. Romberg is negative. Reflexes: Deep tendon reflexes are symmetric and normal bilaterally.   ASSESSMENT: Mr. Lewis Stuller is a 58 y.o. AA male presenting with confusion and dizziness on 11/29/2013. Imaging confirmed a left lenticular nucleus/caudate infarct; L M2 high-grade stenosis. Infarct felt to be  secondary to small vessel disease. Patient with resultant mild confusion, mild right sided weakness, and mile speech hesitancy. Stroke risk factors of Hypertension, Hyperlipidemia, Morbid obesity, and possible OSA.  PLAN: I had a long discussion with the patient and wife regarding his recent stroke, discussed results of evaluation in the hospital and answered questions. Continue clopidogrel 75 mg orally every day  for secondary stroke prevention and maintain strict control of hypertension with blood pressure goal below 130/90, pre-diabetes with hemoglobin A1c goal below 6.5% and lipids with LDL cholesterol goal below 100 mg/dL. Graduated return to driving instructions were given to patient and wife.  He was advised to continue to take all of his medications as prescribed, eat a healthy diet and exercise at least  30 minutes 4-5 days a week to loose weight.  Followup with Dr. Roda Shutters in 6 months.  Meds ordered this encounter  Medications  . clopidogrel (PLAVIX) 75 MG tablet    Sig: Take 1 tablet (75 mg total) by mouth daily.    Dispense:  90 tablet    Refill:  3    Order Specific Question:  Supervising Provider    Answer:  Delia Heady [[id number]]   Return in about 6 months (around 10/19/2014) for stroke revisit.  Tawny Asal LAM, MSN, FNP-BC, A/GNP-C 04/18/2014, 8:58 PM [facility] [address], [address] [place], [place] [address] [phone]  Note: This document was prepared with digital dictation and possible smart phrase technology. Any transcriptional errors that result from this process are unintentional.

## 2014-04-18 NOTE — Patient Instructions (Addendum)
Continue clopidogrel 75 mg orally every day  for secondary stroke prevention and maintain strict control of hypertension with blood pressure goal below 130/90, diabetes with hemoglobin A1c goal below 6.5% and lipids with LDL cholesterol goal below 100 mg/dL.   Followup in the future with Dr. Roda Shutters in 6 months.  STROKE/TIA INSTRUCTIONS SMOKING Cigarette smoking nearly doubles your risk of having a stroke & is the single most alterable risk factor  If you smoke or have smoked in the last 12 months, you are advised to quit smoking for your health.  Most of the excess cardiovascular risk related to smoking disappears within a year of stopping.  Ask you doctor about anti-smoking medications  [place] Quit Line: 1-800-QUIT NOW  Free Smoking Cessation Classes [phone]  CHOLESTEROL Know your levels; limit fat & cholesterol in your diet  Lab Results  Component Value Date   CHOL 176 11/29/2013   HDL 27* 11/29/2013   LDLCALC 130* 11/29/2013   TRIG 94 11/29/2013   CHOLHDL 6.5 11/29/2013      Many patients benefit from treatment even if their cholesterol is at goal.  Goal: Total Cholesterol less than 160  Goal:  LDL less than 100  Goal:  HDL greater than 40  Goal:  Triglycerides less than 150  BLOOD PRESSURE American Stroke Association blood pressure target is less that 120/80 mm/Hg  Your discharge blood pressure is:  BP: 132/74 mmHg  Monitor your blood pressure  Limit your salt and alcohol intake  Many individuals will require more than one medication for high blood pressure  DIABETES (A1c is a blood sugar average for last 3 months) Goal A1c is under 7% (A1c is blood sugar average for last 3 months)  Diabetes: No known diagnosis of diabetes    Lab Results  Component Value Date   HGBA1C 6.2* 11/29/2013    Your A1c can be lowered with medications, healthy diet, and exercise.  Check your blood sugar as directed by your physician  Call your physician if you experience unexplained or low blood  sugars.  PHYSICAL ACTIVITY/REHABILITATION Goal is 30 minutes at least 4 days per week    Activity decreases your risk of heart attack and stroke and makes your heart stronger.  It helps control your weight and blood pressure; helps you relax and can improve your mood.  Participate in a regular exercise program.  Talk with your doctor about the best form of exercise for you (dancing, walking, swimming, cycling).  DIET/WEIGHT Goal is to maintain a healthy weight  Your height is:  Height: 5\' 8"  (172.7 cm) Your current weight is: Weight: 265 lb 8 oz (120.43 kg) Your body Mass Index (BMI) is:  BMI (Calculated): 40.5  Following the type of diet specifically designed for you will help prevent another stroke.  Your goal Body Mass Index (BMI) is 19-24.  Healthy food habits can help reduce 3 risk factors for stroke:  High cholesterol, hypertension, and excess weight.

## 2014-04-24 ENCOUNTER — Encounter (HOSPITAL_BASED_OUTPATIENT_CLINIC_OR_DEPARTMENT_OTHER): Payer: Self-pay

## 2014-04-25 ENCOUNTER — Encounter (HOSPITAL_BASED_OUTPATIENT_CLINIC_OR_DEPARTMENT_OTHER): Payer: Self-pay

## 2014-05-01 ENCOUNTER — Ambulatory Visit (HOSPITAL_BASED_OUTPATIENT_CLINIC_OR_DEPARTMENT_OTHER): Payer: Self-pay | Attending: Cardiology | Admitting: Radiology

## 2014-05-01 VITALS — Ht 68.0 in | Wt 261.0 lb

## 2014-05-01 DIAGNOSIS — I10 Essential (primary) hypertension: Secondary | ICD-10-CM | POA: Insufficient documentation

## 2014-05-01 DIAGNOSIS — G4733 Obstructive sleep apnea (adult) (pediatric): Secondary | ICD-10-CM | POA: Insufficient documentation

## 2014-05-01 DIAGNOSIS — G4710 Hypersomnia, unspecified: Secondary | ICD-10-CM | POA: Insufficient documentation

## 2014-05-06 ENCOUNTER — Other Ambulatory Visit: Payer: Self-pay | Admitting: Nurse Practitioner

## 2014-05-22 ENCOUNTER — Telehealth: Payer: Self-pay | Admitting: Cardiology

## 2014-05-22 DIAGNOSIS — G4733 Obstructive sleep apnea (adult) (pediatric): Secondary | ICD-10-CM | POA: Insufficient documentation

## 2014-05-22 NOTE — Sleep Study (Signed)
       NAME:  Scott Richardson DATE OF BIRTH:  11/28/1955 MEDICAL RECORD NUMBER [id number] LOCATION:  [facility] PHYSICIAN:  Armanda Magic R DATE OF STUDY:  05/01/2104  SLEEP STUDY TYPE:  Nocturnal Polysomnogram  REFERRING PHYSICIAN:  Turner, Traci R  INDICATION FOR STUDY:  Loud snoring, hypersomina  EPWORTH SLEEPINESS SCORE:  16 HEIGHT:  5'8" WEIGHT:  261lbs  Body mass index 40 kg/m2 NECK SIZE:  19"  MEDICATION:  Reviewed in sleep record  SLEEP ARCHITECTURE:  The patient had a total sleep time of 365 minutes with no slow wave sleep and 91.5 minutes of REM sleep.  Sleep onset latency was 12 minutes and REM onset was 74.5 min.  Sleep efficiency was slightly reduced at 84.4%.  RESPIRATORY DATA:  The patient was found to have 11 apneas and 55 obstructive hypopneas, giving him an AHI of 11.3 events per hour.  The events occurred in all positions and there was moderate snoring noted.  Most events were noted during REM sleep.    OXYGEN DATA:  There was oxygen desaturation as low as 78% with the patient's obstructive events.    CARDIAC DATA:  The patient maintained sinus bradycardia during the study.  There were occasional PAC's.  The average HR was 52.8bpm.  MOVEMENT/PARASOMNIA:  No significant limb movements or abnormal behaviors were noted.    IMPRESSION: 1.  Moderate Obstructive sleep apnea/hypopnea syndrome with a total AHI of 11.3 events per hour and oxygen desaturation as low as 78%.    Events occurred mainly in REM sleep in the supine position.   2.  Occasional PAC's. 3.  Obesity   RECOMMENDATIONS: 1.  Recommend CPAP titration given patient's symptoms of hypersomnia with need for daytime naps, elevated Epworth sleepiness scale and history of HTN and CVA.   2.  The patient should be counseled in good sleep hygiene and weight loss. 3. Proceed with expeditious CPAP titration.  Armanda Magic, MD Diplomate,  American Board of Sleep Medicine [facility]

## 2014-05-22 NOTE — Telephone Encounter (Signed)
Please let patient know that he has mild OSA with an AHI of 11/hr and set him up for CPAP titration given his elevated epworth sleepiness scale and significant hypersomnia

## 2014-05-23 ENCOUNTER — Other Ambulatory Visit: Payer: Self-pay | Admitting: General Surgery

## 2014-05-23 DIAGNOSIS — G4733 Obstructive sleep apnea (adult) (pediatric): Secondary | ICD-10-CM

## 2014-05-23 NOTE — Telephone Encounter (Signed)
Pt is aware. Ordered CPAP titration

## 2014-06-06 ENCOUNTER — Encounter: Payer: Self-pay | Admitting: Cardiology

## 2014-06-12 ENCOUNTER — Ambulatory Visit: Payer: Self-pay | Admitting: Cardiology

## 2014-06-14 ENCOUNTER — Encounter: Payer: Self-pay | Admitting: Cardiology

## 2014-06-14 ENCOUNTER — Ambulatory Visit (INDEPENDENT_AMBULATORY_CARE_PROVIDER_SITE_OTHER): Payer: Self-pay | Admitting: Cardiology

## 2014-06-14 VITALS — BP 140/88 | HR 62 | Ht 68.0 in | Wt 261.4 lb

## 2014-06-14 DIAGNOSIS — I110 Hypertensive heart disease with heart failure: Secondary | ICD-10-CM

## 2014-06-14 DIAGNOSIS — R5383 Other fatigue: Secondary | ICD-10-CM

## 2014-06-14 DIAGNOSIS — G4733 Obstructive sleep apnea (adult) (pediatric): Secondary | ICD-10-CM

## 2014-06-14 DIAGNOSIS — I429 Cardiomyopathy, unspecified: Secondary | ICD-10-CM

## 2014-06-14 DIAGNOSIS — I428 Other cardiomyopathies: Secondary | ICD-10-CM

## 2014-06-14 DIAGNOSIS — R070 Pain in throat: Secondary | ICD-10-CM

## 2014-06-14 DIAGNOSIS — I10 Essential (primary) hypertension: Secondary | ICD-10-CM

## 2014-06-14 DIAGNOSIS — R5381 Other malaise: Secondary | ICD-10-CM

## 2014-06-14 DIAGNOSIS — G4710 Hypersomnia, unspecified: Secondary | ICD-10-CM

## 2014-06-14 DIAGNOSIS — I43 Cardiomyopathy in diseases classified elsewhere: Principal | ICD-10-CM

## 2014-06-14 DIAGNOSIS — I509 Heart failure, unspecified: Secondary | ICD-10-CM

## 2014-06-14 NOTE — Patient Instructions (Signed)
Your physician recommends that you return for lab work today for cbc, bmet and tsh.  Your physician has requested that you have a lexiscan myoview. For further information please visit [email or web address] Please follow instruction sheet, as given.  You have been referred to ENT for throat discomfort.   Your physician wants you to follow-up in: 6 months with Dr. Mayford Knife. You will receive a reminder letter in the mail two months in advance. If you don't receive a letter, please call our office to schedule the follow-up appointment.

## 2014-06-14 NOTE — Progress Notes (Addendum)
[address] [place], [place]  [address] Phone: [phone] Fax:  [phone]  Date:  06/14/2014   ID:  Scott Richardson, DOB 12-20-1955, MRN [id number]  PCP:  Scott Miyamoto, MD  Cardiologist:  Scott Magic, MD     History of Present Illness: [name] comes back today for followup. He has HTN, HLD and obesity. His past echo from 2009 showed an EF of 65%. He stopped all of his BP medicines for at least 9 months (due to cost)- presented with acute stroke with associated encephalopathy in the setting of hypertensive crisis in March of 2015. TEE showed EF down to 30 to 35%. 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 His BP meds were restarted. Referred for outpatient Myoview. He was then seen back in followup- had had no medicines at that visit. Was not going to be able to afford the medicines he was put back on. Changed to Lopressor and Lisinopril. No insurance. His stress test was placed on hold due to finances. He was seen back in the ER with stroke like symptoms again and was evaluated by [facility]. He was sent home after neuro eval. BP had improved. Due to poorly controlled HTN he was set up for PSG which showed mild OSA at 11/hr and he was set up for CPAP titration.  He has not had his CPAP titration yet.  He is doing well today.  He denies any chest pain, SOB, DOE, LE edema, dizziness, palpitations or syncope.  Wt Readings from Last 3 Encounters:  06/14/14 261 lb 6.4 oz (118.57 kg)  05/01/14 261 lb (118.389 kg)  04/18/14 265 lb 8 oz (120.43 kg)     Past Medical History  Diagnosis Date  . Chest pain   . HTN (hypertension)   . Obesity   . HLD (hyperlipidemia)   . LVH (left ventricular hypertrophy)   . Stroke   . Noncompliance     Current Outpatient Prescriptions  Medication Sig Dispense Refill  . cloNIDine (CATAPRES) 0.2 MG tablet Take 0.2 mg by mouth daily.        . clopidogrel (PLAVIX) 75 MG tablet Take 1 tablet (75 mg total) by mouth daily.  90 tablet  3  . fish  oil-omega-3 fatty acids 1000 MG capsule Take 2 g by mouth daily.        . hydrochlorothiazide (HYDRODIURIL) 25 MG tablet Take 1 tablet (25 mg total) by mouth daily.  30 tablet  0  . Levothyroxine Sodium 88 MCG CAPS Take 1 capsule (88 mcg total) by mouth daily before breakfast.  30 capsule  0  . lisinopril (PRINIVIL,ZESTRIL) 40 MG tablet Take 1 tablet (40 mg total) by mouth daily.  90 tablet  3  . metoprolol tartrate (LOPRESSOR) 25 MG tablet TAKE ONE TABLET BY MOUTH TWICE DAILY  180 tablet  0  . pravastatin (PRAVACHOL) 40 MG tablet Take 40 mg by mouth daily.         No current facility-administered medications for this visit.    Allergies:   No Known Allergies  Social History:  The patient  reports that he has never smoked. He has never used smokeless tobacco. He reports that he does not drink alcohol or use illicit drugs.   Family History:  The patient's family history includes Blindness in his maternal aunt, maternal aunt, maternal aunt, and maternal aunt; Hypertension in his father and mother.   ROS:  Please see the history of present illness.      All  other systems reviewed and negative.   PHYSICAL EXAM: VS:  BP 140/88  Pulse 62  Ht  (1.727 m)  Wt 261 lb 6.4 oz (118.57 kg)  BMI 39.75 kg/m2 Well nourished, well developed, in no acute distress HEENT: normal Neck: no JVD Cardiac:  normal S1, S2; RRR; no murmur Lungs:  clear to auscultation bilaterally, no wheezing, rhonchi or rales Abd: soft, nontender, no hepatomegaly Ext: no edema Skin: warm and dry Neuro:  CNs 2-12 intact, no focal abnormalities noted  Assessment / Plan:  1. S/P recent CVA - residual memory issues but improving. Felt that the acute event due to uncontrolled HTN.  2. HTN - controlled. I have left him on his current regimen of metoprolol and Lisinopril - check BMET 3. Obesity  4. 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  - will set up now for Lexiscan myoview to rule out ischemia since this has never been assessed 5. Hypersmonia with snoring and mild OSA - he is set up for CPAP titration next month 6.  Fatigue of ? Etiology - ? Secondary to OSA - check CBC/TSH 7.  Throat discomfort that occurred after TEE with his CVA - I will refer him to ENT for evaluation  Follow up with me in 6 months      Signed, Scott Magic, MD 06/14/2014 2:52 PM

## 2014-06-15 LAB — BASIC METABOLIC PANEL
BUN: 17 mg/dL (ref 6–23)
CHLORIDE: 103 meq/L (ref 96–112)
CO2: 26 mEq/L (ref 19–32)
Calcium: 9.5 mg/dL (ref 8.4–10.5)
Creatinine, Ser: 1.1 mg/dL (ref 0.4–1.5)
GFR: 87.53 mL/min (ref >60.00)
Glucose, Bld: 106 mg/dL — ABNORMAL HIGH (ref 70–99)
POTASSIUM: 3.5 meq/L (ref 3.5–5.1)
Sodium: 137 mEq/L (ref 135–145)

## 2014-06-15 LAB — CBC
HCT: 51.7 % (ref 39.0–52.0)
HEMOGLOBIN: 17.1 g/dL — AB (ref 13.0–17.0)
MCHC: 33.1 g/dL (ref 30.0–36.0)
MCV: 92.3 fl (ref 78.0–100.0)
PLATELETS: 192 10*3/uL (ref 150.0–400.0)
RBC: 5.6 Mil/uL (ref 4.22–5.81)
RDW: 13.7 % (ref 11.5–15.5)
WBC: 8.1 10*3/uL (ref 4.0–10.5)

## 2014-06-15 LAB — TSH: TSH: 0.54 u[IU]/mL (ref 0.35–4.50)

## 2014-06-19 ENCOUNTER — Other Ambulatory Visit: Payer: Self-pay | Admitting: General Surgery

## 2014-06-19 DIAGNOSIS — Z79899 Other long term (current) drug therapy: Secondary | ICD-10-CM

## 2014-06-19 MED ORDER — POTASSIUM CHLORIDE CRYS ER 20 MEQ PO TBCR: 20.0000 meq | EXTENDED_RELEASE_TABLET | Freq: Every day | ORAL | Status: DC

## 2014-06-26 ENCOUNTER — Other Ambulatory Visit: Payer: Self-pay

## 2014-06-27 ENCOUNTER — Ambulatory Visit (HOSPITAL_COMMUNITY): Payer: Self-pay | Attending: Cardiology | Admitting: Radiology

## 2014-06-27 ENCOUNTER — Other Ambulatory Visit (INDEPENDENT_AMBULATORY_CARE_PROVIDER_SITE_OTHER): Payer: Self-pay | Admitting: *Deleted

## 2014-06-27 VITALS — BP 124/68 | HR 45 | Ht 68.0 in | Wt 263.0 lb

## 2014-06-27 DIAGNOSIS — I428 Other cardiomyopathies: Secondary | ICD-10-CM | POA: Insufficient documentation

## 2014-06-27 DIAGNOSIS — I10 Essential (primary) hypertension: Secondary | ICD-10-CM | POA: Insufficient documentation

## 2014-06-27 DIAGNOSIS — I429 Cardiomyopathy, unspecified: Secondary | ICD-10-CM

## 2014-06-27 DIAGNOSIS — I779 Disorder of arteries and arterioles, unspecified: Secondary | ICD-10-CM | POA: Insufficient documentation

## 2014-06-27 DIAGNOSIS — R9431 Abnormal electrocardiogram [ECG] [EKG]: Secondary | ICD-10-CM | POA: Insufficient documentation

## 2014-06-27 DIAGNOSIS — Z8673 Personal history of transient ischemic attack (TIA), and cerebral infarction without residual deficits: Secondary | ICD-10-CM | POA: Insufficient documentation

## 2014-06-27 DIAGNOSIS — Z79899 Other long term (current) drug therapy: Secondary | ICD-10-CM

## 2014-06-27 DIAGNOSIS — I6350 Cerebral infarction due to unspecified occlusion or stenosis of unspecified cerebral artery: Secondary | ICD-10-CM

## 2014-06-27 DIAGNOSIS — E785 Hyperlipidemia, unspecified: Secondary | ICD-10-CM | POA: Insufficient documentation

## 2014-06-27 LAB — BASIC METABOLIC PANEL
BUN: 22 mg/dL (ref 6–23)
CALCIUM: 9.2 mg/dL (ref 8.4–10.5)
CHLORIDE: 102 meq/L (ref 96–112)
CO2: 26 meq/L (ref 19–32)
CREATININE: 1.3 mg/dL (ref 0.4–1.5)
GFR: 75.61 mL/min (ref >60.00)
GLUCOSE: 103 mg/dL — AB (ref 70–99)
Potassium: 4.2 mEq/L (ref 3.5–5.1)
Sodium: 135 mEq/L (ref 135–145)

## 2014-06-27 MED ORDER — TECHNETIUM TC 99M SESTAMIBI GENERIC - CARDIOLITE
30.0000 | Freq: Once | INTRAVENOUS | Status: AC | PRN
  Administered 2014-06-27: 30 via INTRAVENOUS

## 2014-06-27 MED ORDER — REGADENOSON 0.4 MG/5ML IV SOLN
0.4000 mg | Freq: Once | INTRAVENOUS | Status: AC
  Administered 2014-06-27: 0.4 mg via INTRAVENOUS

## 2014-06-27 MED ORDER — TECHNETIUM TC 99M SESTAMIBI GENERIC - CARDIOLITE
10.0000 | Freq: Once | INTRAVENOUS | Status: AC | PRN
  Administered 2014-06-27: 10 via INTRAVENOUS

## 2014-06-27 NOTE — Progress Notes (Signed)
[facility] SITE 3 NUCLEAR MED [address] [place], [place] [address] [phone]    Cardiology Nuclear Med Study  Scott Richardson is a 58 y.o. male     MRN : [id number]     DOB: 1956/06/15  Procedure Date: 06/27/2014  Nuclear Med Background Indication for Stress Test:  Evaluation for Ischemia and Abnormal EKG History:  No known CAD Cardiac Risk Factors: Carotid Disease and Hypertension  Symptoms:  None indicated   Nuclear Pre-Procedure Caffeine/Decaff Intake:  None> 12 hrs NPO After: 10:15pm   Lungs:  clear O2 Sat: 98% on room air. IV 0.9% NS with Angio Cath:  22g  IV Site: L Wrist x 1, tolerated well IV Started by:  Irean Hong, RN  Chest Size (in):  50 Cup Size: n/a  Height: 5\' 8"  (1.727 m)  Weight:  263 lb (119.296 kg)  BMI:  Body mass index is 40 kg/(m^2). Tech Comments:  Patient took Lopressor,  Catapres,and Lisinopril this am. Irean Hong, RN.    Nuclear Med Study 1 or 2 day study: 1 day  Stress Test Type:  Lexiscan  Reading MD: N/A  Order Authorizing Provider:  Armanda Magic, MD  Resting Radionuclide: Technetium 71m Sestamibi  Resting Radionuclide Dose: 11.0 mCi   Stress Radionuclide:  Technetium 42m Sestamibi  Stress Radionuclide Dose: 33.0 mCi           Stress Protocol Rest HR: 45 Stress HR: 70  Rest BP: 124/68 Stress BP: 124/84  Exercise Time (min): n/a METS: n/a           Dose of Adenosine (mg):  n/a Dose of Lexiscan: 0.4 mg  Dose of Atropine (mg): n/a Dose of Dobutamine: n/a mcg/kg/min (at max HR)  Stress Test Technologist: Nelson Chimes, BS-ES  Nuclear Technologist:  Kerby Nora, CNMT     Rest Procedure:  Myocardial perfusion imaging was performed at rest 45 minutes following the intravenous administration of Technetium 46m Sestamibi. Rest ECG: NSR-RBBB  Stress Procedure:  The patient received IV Lexiscan 0.4 mg over 15-seconds.  Technetium 4m Sestamibi injected at 30-seconds.  Quantitative spect images were obtained after a 45  minute delay.   Stress ECG: No significant change from baseline ECG  QPS Raw Data Images:  Normal; no motion artifact; normal heart/lung ratio. Stress Images:  There is decreased uptake in the inferior wall. Rest Images:  There is decreased uptake in the inferior wall. Subtraction (SDS):  There is a fixed defect that is most consistent with a previous infarction. Transient Ischemic Dilatation (Normal <1.22):  1.27 Lung/Heart Ratio (Normal <0.45):  0.28  Quantitative Gated Spect Images QGS EDV:  132 ml QGS ESV:  76 ml  Impression Exercise Capacity:  Lexiscan with no exercise. BP Response:  Normal blood pressure response. Clinical Symptoms:  No symptoms. ECG Impression:  No significant ECG changes with Lexiscan. Comparison with Prior Nuclear Study: No previous nuclear study performed  Overall Impression:  Intermediate risk stress nuclear study with moderate sized and severe intensity fixed (SDS 2) inferior defect, consistent with scar.  LV Ejection Fraction: 43%.  LV Wall Motion:  inferior akinesis  Chrystie Nose, MD, [facility] Board Certified in Nuclear Cardiology Attending Cardiologist [facility]

## 2014-06-30 ENCOUNTER — Telehealth: Payer: Self-pay | Admitting: Cardiology

## 2014-06-30 DIAGNOSIS — I429 Cardiomyopathy, unspecified: Secondary | ICD-10-CM

## 2014-06-30 NOTE — Telephone Encounter (Signed)
CT Coronary Morph ordered.

## 2014-07-04 ENCOUNTER — Ambulatory Visit (HOSPITAL_BASED_OUTPATIENT_CLINIC_OR_DEPARTMENT_OTHER): Payer: Self-pay | Attending: Cardiology | Admitting: Radiology

## 2014-07-10 ENCOUNTER — Encounter: Payer: Self-pay | Admitting: Cardiology

## 2014-07-12 ENCOUNTER — Telehealth: Payer: Self-pay | Admitting: Cardiology

## 2014-07-12 NOTE — Telephone Encounter (Signed)
Patients wife called and she would like results of stress test. Please call and advise.

## 2014-07-12 NOTE — Telephone Encounter (Signed)
Spoke with wife and made aware.

## 2014-07-14 ENCOUNTER — Ambulatory Visit (HOSPITAL_COMMUNITY)
Admission: RE | Admit: 2014-07-14 | Discharge: 2014-07-14 | Disposition: A | Discharge status: Home / Self Care | Payer: Self-pay | Source: Ambulatory Visit | Attending: Cardiology | Admitting: Cardiology

## 2014-07-14 DIAGNOSIS — I429 Cardiomyopathy, unspecified: Secondary | ICD-10-CM

## 2014-07-14 DIAGNOSIS — R079 Chest pain, unspecified: Secondary | ICD-10-CM

## 2014-07-14 MED ORDER — IOHEXOL 350 MG/ML SOLN
100.0000 mL | Freq: Once | INTRAVENOUS | Status: AC | PRN
  Administered 2014-07-14: 80 mL via INTRAVENOUS

## 2014-07-14 MED ORDER — NITROGLYCERIN 0.4 MG SL SUBL
0.4000 mg | SUBLINGUAL_TABLET | SUBLINGUAL | Status: DC | PRN
  Administered 2014-07-14: 0.4 mg via SUBLINGUAL
  Filled 2014-07-14: qty 25

## 2014-07-14 MED ORDER — NITROGLYCERIN 0.4 MG SL SUBL
SUBLINGUAL_TABLET | SUBLINGUAL | Status: AC
  Filled 2014-07-14: qty 1

## 2014-07-20 ENCOUNTER — Telehealth: Payer: Self-pay | Admitting: Cardiology

## 2014-07-20 NOTE — Telephone Encounter (Signed)
New message ° ° ° ° °Want test results °

## 2014-07-21 ENCOUNTER — Other Ambulatory Visit: Payer: Self-pay

## 2014-07-21 DIAGNOSIS — E785 Hyperlipidemia, unspecified: Secondary | ICD-10-CM

## 2014-07-21 DIAGNOSIS — R5383 Other fatigue: Secondary | ICD-10-CM

## 2014-07-21 MED ORDER — ASPIRIN EC 81 MG PO TBEC: 81.0000 mg | DELAYED_RELEASE_TABLET | Freq: Every day | ORAL | Status: AC

## 2014-07-21 MED ORDER — ATORVASTATIN CALCIUM 80 MG PO TABS: 80.0000 mg | ORAL_TABLET | Freq: Every day | ORAL | Status: DC

## 2014-07-21 NOTE — Telephone Encounter (Signed)
Clarified with Mr. Saso wife new orders with Mr. Liebelt permission. She agrees with new treatment plan.

## 2014-07-21 NOTE — Telephone Encounter (Signed)
Patient informed of results and verbal understanding expressed.   Instructed Scott Richardson to: 1) STOP Pravastatin 2) START Atorvastatin 80 mg daily 3) START Aspirin 81 mg daily 4) Come in for fasting lab work on September 01, 2014 5) CALL OUR OFFICE if chest pain or shortness of breath develops   [name] agrees to treatment plan.

## 2014-07-21 NOTE — Telephone Encounter (Signed)
New message    Wife calling to discuss recent test, my charts information that she saw.

## 2014-08-06 ENCOUNTER — Ambulatory Visit (HOSPITAL_BASED_OUTPATIENT_CLINIC_OR_DEPARTMENT_OTHER): Payer: Self-pay | Attending: Cardiology

## 2014-08-06 VITALS — Ht 68.0 in | Wt 261.0 lb

## 2014-08-06 DIAGNOSIS — Z9989 Dependence on other enabling machines and devices: Secondary | ICD-10-CM

## 2014-08-06 DIAGNOSIS — Z6839 Body mass index (BMI) 39.0-39.9, adult: Secondary | ICD-10-CM | POA: Insufficient documentation

## 2014-08-06 DIAGNOSIS — G4733 Obstructive sleep apnea (adult) (pediatric): Secondary | ICD-10-CM | POA: Insufficient documentation

## 2014-08-18 ENCOUNTER — Telehealth: Payer: Self-pay | Admitting: Cardiology

## 2014-08-18 NOTE — Addendum Note (Signed)
Addended by: Quintella Reichert on: 08/18/2014 06:25 PM   Modules accepted: Orders

## 2014-08-18 NOTE — Sleep Study (Signed)
   NAME: Scott Richardson DATE OF BIRTH:  Oct 23, 1955 MEDICAL RECORD NUMBER [id number]  LOCATION: [facility]  PHYSICIAN: TURNER,TRACI R  DATE OF STUDY: 08/06/2014  SLEEP STUDY TYPE: Positive Airway Pressure Titration               REFERRING PHYSICIAN: Quintella Reichert, MD  INDICATION FOR STUDY: OSA  EPWORTH SLEEPINESS SCORE: 16 HEIGHT: 5\' 8"  (172.7 cm)  WEIGHT: 261 lb (118.389 kg)    Body mass index is 39.69 kg/(m^2).  NECK SIZE: 19 in. BMI: 40  MEDICATIONS: Reviewed in the chart  SLEEP ARCHITECTURE: The patient slept for a total of 367 minutes with no slow wave sleep and 95 minutes of REM sleep.  The latency to sleep onset was 11.5 minutes and the latency to REM onset was 92 minutes.  The sleep efficiency was normal at 94%.    RESPIRATORY DATA: The patient was started at [facility] and pressure was increased for snoring and respiratory events to a maximum pressure of 13cm H2O.  The patient was able to achieve REM supine sleep and maintain this pressure without any further respiratory events for a prolonged period of time.  OXYGEN DATA: The nadir O2 sat was81% in REM and 87% in NREM.  There total time with O2 sats <885 was 1 minutes and 8 seconds.  CARDIAC DATA: The patient maintained NSR throughout the study  MOVEMENT/PARASOMNIA: There were no periodic limb movements or REM behavior disorders noted.  IMPRESSION/ RECOMMENDATION:   1.  Mild Obstructive sleep apnea/hypopnea syndrome with AHI 11.3 events per hour on nocturnal polysomnogram. 2. Successful CPAP titration to 13cm H2O. 3.  Morbid obesity.  The patient should be counseled in good sleep hygiene and weight loss. 4.  The patient should avoid sleeping in the supine position. 5.  Recommend ResMed CPAP device with heated humidifier, C flex of 3 and set CPAP pressure at 13cm H2O with medium [employer] Simplus full face mask.   Quintella Reichert Diplomate, American Board of Sleep Medicine  ELECTRONICALLY SIGNED  ON:  08/18/2014, 6:15 PM [facility] PH: [phone]   FX: [phone] ACCREDITED BY THE AMERICAN ACADEMY OF SLEEP MEDICINE

## 2014-08-19 NOTE — Telephone Encounter (Signed)
Please let patient know that he had successful CPAP titration and let [facility] know that order is in Epic.  Please set up OV with me in 10 weeks

## 2014-08-22 ENCOUNTER — Telehealth: Payer: Self-pay | Admitting: Cardiology

## 2014-08-22 NOTE — Telephone Encounter (Signed)
Informed patient's wife he had a successful CPAP titration. Sent staff message to Henderson Newcomer at [facility] to inform that order is in EPIC.  OV scheduled for 11/06/14.

## 2014-08-22 NOTE — Telephone Encounter (Signed)
New message           Pt wife calling for results of sleep test

## 2014-08-22 NOTE — Telephone Encounter (Signed)
Addressed in other phone note.

## 2014-09-01 ENCOUNTER — Other Ambulatory Visit: Payer: Self-pay

## 2014-09-04 ENCOUNTER — Telehealth: Payer: Self-pay | Admitting: Cardiology

## 2014-09-04 NOTE — Telephone Encounter (Signed)
Spoke with wife and stated ok to take medications.

## 2014-09-04 NOTE — Telephone Encounter (Signed)
New Message  Pt wife called to resch lab work; she wanted to know if pt should take medications the day of lab test; please call back and discuss.

## 2014-09-05 ENCOUNTER — Other Ambulatory Visit (INDEPENDENT_AMBULATORY_CARE_PROVIDER_SITE_OTHER): Payer: Self-pay | Admitting: *Deleted

## 2014-09-05 DIAGNOSIS — E785 Hyperlipidemia, unspecified: Secondary | ICD-10-CM

## 2014-09-05 DIAGNOSIS — R5383 Other fatigue: Secondary | ICD-10-CM

## 2014-09-05 LAB — LIPID PANEL
CHOL/HDL RATIO: 5
Cholesterol: 85 mg/dL (ref 0–200)
HDL: 16.5 mg/dL — ABNORMAL LOW (ref >39.00)
LDL Cholesterol: 55 mg/dL (ref 0–99)
NonHDL: 68.5
TRIGLYCERIDES: 67 mg/dL (ref 0.0–149.0)
VLDL: 13.4 mg/dL (ref 0.0–40.0)

## 2014-09-05 LAB — HEMOGLOBIN A1C: HEMOGLOBIN A1C: 6.8 % — AB (ref 4.6–6.5)

## 2014-09-05 LAB — ALT: ALT: 27 U/L (ref 0–53)

## 2014-09-20 ENCOUNTER — Telehealth: Payer: Self-pay | Admitting: Cardiology

## 2014-09-20 NOTE — Telephone Encounter (Signed)
Confirmed medication list.

## 2014-09-20 NOTE — Telephone Encounter (Signed)
New Message  Pt wife requested to speak with Rn about Pt medication that he was ordered to stop taking. Please call back and discuss.

## 2014-09-30 ENCOUNTER — Encounter (HOSPITAL_COMMUNITY): Payer: Self-pay | Admitting: Emergency Medicine

## 2014-09-30 ENCOUNTER — Observation Stay (HOSPITAL_COMMUNITY)
Admission: EM | Admit: 2014-09-30 | Discharge: 2014-10-01 | Disposition: A | Discharge status: Home / Self Care | Payer: Self-pay | Attending: Internal Medicine | Admitting: Internal Medicine

## 2014-09-30 DIAGNOSIS — I2510 Atherosclerotic heart disease of native coronary artery without angina pectoris: Secondary | ICD-10-CM | POA: Insufficient documentation

## 2014-09-30 DIAGNOSIS — R69 Illness, unspecified: Secondary | ICD-10-CM

## 2014-09-30 DIAGNOSIS — M79601 Pain in right arm: Secondary | ICD-10-CM | POA: Diagnosis present

## 2014-09-30 DIAGNOSIS — R9431 Abnormal electrocardiogram [ECG] [EKG]: Secondary | ICD-10-CM | POA: Diagnosis present

## 2014-09-30 DIAGNOSIS — G4733 Obstructive sleep apnea (adult) (pediatric): Secondary | ICD-10-CM | POA: Diagnosis present

## 2014-09-30 DIAGNOSIS — E785 Hyperlipidemia, unspecified: Secondary | ICD-10-CM | POA: Insufficient documentation

## 2014-09-30 DIAGNOSIS — I517 Cardiomegaly: Secondary | ICD-10-CM | POA: Insufficient documentation

## 2014-09-30 DIAGNOSIS — E669 Obesity, unspecified: Secondary | ICD-10-CM | POA: Insufficient documentation

## 2014-09-30 DIAGNOSIS — M79621 Pain in right upper arm: Principal | ICD-10-CM

## 2014-09-30 DIAGNOSIS — Z8673 Personal history of transient ischemic attack (TIA), and cerebral infarction without residual deficits: Secondary | ICD-10-CM | POA: Insufficient documentation

## 2014-09-30 DIAGNOSIS — Z7982 Long term (current) use of aspirin: Secondary | ICD-10-CM | POA: Insufficient documentation

## 2014-09-30 DIAGNOSIS — R001 Bradycardia, unspecified: Secondary | ICD-10-CM | POA: Diagnosis present

## 2014-09-30 DIAGNOSIS — I429 Cardiomyopathy, unspecified: Secondary | ICD-10-CM | POA: Insufficient documentation

## 2014-09-30 DIAGNOSIS — I4510 Unspecified right bundle-branch block: Secondary | ICD-10-CM

## 2014-09-30 DIAGNOSIS — I10 Essential (primary) hypertension: Secondary | ICD-10-CM | POA: Insufficient documentation

## 2014-09-30 LAB — BASIC METABOLIC PANEL
Anion gap: 9 (ref 5–15)
BUN: 18 mg/dL (ref 6–23)
CO2: 26 mmol/L (ref 19–32)
Calcium: 9.1 mg/dL (ref 8.4–10.5)
Chloride: 102 mEq/L (ref 96–112)
Creatinine, Ser: 1.17 mg/dL (ref 0.50–1.35)
GFR calc Af Amer: 78 mL/min — ABNORMAL LOW (ref >90)
GFR, EST NON AFRICAN AMERICAN: 67 mL/min — AB (ref >90)
Glucose, Bld: 138 mg/dL — ABNORMAL HIGH (ref 70–99)
POTASSIUM: 3.7 mmol/L (ref 3.5–5.1)
Sodium: 137 mmol/L (ref 135–145)

## 2014-09-30 LAB — CBC
HCT: 45.8 % (ref 39.0–52.0)
Hemoglobin: 15.4 g/dL (ref 13.0–17.0)
MCH: 30.3 pg (ref 26.0–34.0)
MCHC: 33.6 g/dL (ref 30.0–36.0)
MCV: 90.2 fL (ref 78.0–100.0)
Platelets: 208 10*3/uL (ref 150–400)
RBC: 5.08 MIL/uL (ref 4.22–5.81)
RDW: 13.1 % (ref 11.5–15.5)
WBC: 7.8 10*3/uL (ref 4.0–10.5)

## 2014-09-30 LAB — I-STAT TROPONIN, ED: Troponin i, poc: 0.01 ng/mL (ref 0.00–0.08)

## 2014-09-30 NOTE — ED Notes (Signed)
Patient with c/o right arm pain and patient's family also reports that patient was not making sense earlier today Patient with hx of CVA and TIA Patient ambulatory from triage without difficulty Patient alert and oriented x 4 and in NAD Neuro check completed and WNL at time of assessment Patient able to speak in full, complete sentences without difficulty--handles secretions well

## 2014-09-30 NOTE — ED Notes (Signed)
Pt states that he has been having pain from rt shoulder radiating to rt elbow since 0430 this morning.  Pt's family member states that he had one episode of not understanding what he was saying.  Hx of stroke in march and TIA later.  Sx have since resolved.  Pt not having trouble with speech, symmetrical face.

## 2014-09-30 NOTE — ED Provider Notes (Signed)
CSN: [id number]     Arrival date & time 09/30/14  1932 History   First MD Initiated Contact with Patient 09/30/14 2026     Chief Complaint  Patient presents with  . Arm Pain     (Consider location/radiation/quality/duration/timing/severity/associated sxs/prior Treatment) HPI The patient is presenting for right arm pain. He is a bit of a challenging historian because he is not good at providing detail and timing. This is intermittently conflicting with the family members report. Patient reports waking up at 4:30 in the morning having right shoulder and arm pain that radiated to his elbow. This apparently went on for most of the day and was episodic. 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 She reports that he never came into do anything with them because he was complaining of his arm being uncomfortable and remained in the car most of the day. He does not specifically endorse chest pain or dyspnea. Family members report they observed him to look more winded with walking. Past Medical History  Diagnosis Date  . Chest pain   . HTN (hypertension)   . Obesity   . HLD (hyperlipidemia)   . LVH (left ventricular hypertrophy)   . Stroke   . Noncompliance    Past Surgical History  Procedure Laterality Date  . No past surgeries    . Tee without cardioversion N/A 12/01/2013    Procedure: TRANSESOPHAGEAL ECHOCARDIOGRAM (TEE);  Surgeon: Vesta Mixer, MD;  Location: [facility] ENDOSCOPY;  Service: Cardiovascular;  Laterality: N/A;   Family History  Problem Relation Age of Onset  . Hypertension Mother   . Hypertension Father   . Blindness Maternal Aunt   . Blindness Maternal Aunt   . Blindness Maternal Aunt   . Blindness Maternal Aunt    History  Substance Use Topics  . Smoking status: Never Smoker   . Smokeless tobacco: Never Used  . Alcohol Use: No    Review of Systems  10 Systems reviewed and are negative for acute change except as  noted in the HPI.   Allergies  Review of patient's allergies indicates no known allergies.  Home Medications   Prior to Admission medications   Medication Sig Start Date End Date Taking? Authorizing Provider  aspirin EC 81 MG tablet Take 1 tablet (81 mg total) by mouth daily. 07/21/14  Yes Quintella Reichert, MD  atorvastatin (LIPITOR) 80 MG tablet Take 1 tablet (80 mg total) by mouth daily. 07/21/14  Yes Quintella Reichert, MD  clopidogrel (PLAVIX) 75 MG tablet Take 1 tablet (75 mg total) by mouth daily. 04/18/14  Yes Ronal Fear, NP  hydrochlorothiazide (HYDRODIURIL) 25 MG tablet Take 1 tablet (25 mg total) by mouth daily. 12/01/13  Yes Joseph Art, DO  Levothyroxine Sodium 88 MCG CAPS Take 1 capsule (88 mcg total) by mouth daily before breakfast. 12/14/13  Yes Rosalio Macadamia, NP  lisinopril (PRINIVIL,ZESTRIL) 40 MG tablet Take 1 tablet (40 mg total) by mouth daily. 12/14/13  Yes Rosalio Macadamia, NP  omeprazole (PRILOSEC) 40 MG capsule Take 40 mg by mouth daily.   Yes Historical Provider, MD  potassium chloride SA (K-DUR,KLOR-CON) 20 MEQ tablet Take 1 tablet (20 mEq total) by mouth daily. 06/19/14  Yes Quintella Reichert, MD  metoprolol tartrate (LOPRESSOR) 12.5 mg TABS tablet Take 0.5 tablets (12.5 mg total) by mouth 2 (two) times daily. 10/02/14   Rodolph Bong, MD   BP 146/66 mmHg  Pulse 59  Temp(Src) 97.5 F (36.4 C) (Oral)  Resp 20  Ht 5\' 7"  (1.702 m)  Wt 258 lb 4.8 oz (117.164 kg)  BMI 40.45 kg/m2  SpO2 98% Physical Exam  Constitutional: He is oriented to person, place, and time. He appears well-developed and well-nourished.  HENT:  Head: Normocephalic and atraumatic.  Eyes: EOM are normal. Pupils are equal, round, and reactive to light.  Neck: Neck supple.  Cardiovascular: Normal rate, regular rhythm, normal heart sounds and intact distal pulses.   Pulmonary/Chest: Effort normal and breath sounds normal.  Abdominal: Soft. Bowel sounds are normal. He exhibits no distension. There is  no tenderness.  Musculoskeletal: Normal range of motion. He exhibits no edema or tenderness.  Neurological: He is alert and oriented to person, place, and time. He has normal strength. Coordination normal. GCS eye subscore is 4. GCS verbal subscore is 5. GCS motor subscore is 6.  Skin: Skin is warm, dry and intact.  Psychiatric: He has a normal mood and affect.    ED Course  Procedures (including critical care time) Labs Review Labs Reviewed  BASIC METABOLIC PANEL - Abnormal; Notable for the following:    Glucose, Bld 138 (*)    GFR calc non Af Amer 67 (*)    GFR calc Af Amer 78 (*)    All other components within normal limits  CREATININE, SERUM - Abnormal; Notable for the following:    GFR calc non Af Amer 71 (*)    GFR calc Af Amer 83 (*)    All other components within normal limits  LIPID PANEL - Abnormal; Notable for the following:    HDL 24 (*)    All other components within normal limits  HEMOGLOBIN A1C - Abnormal; Notable for the following:    Hgb A1c MFr Bld 6.8 (*)    Mean Plasma Glucose 148 (*)    All other components within normal limits  CBC  CBC  TROPONIN I  TROPONIN I  TROPONIN I  I-STAT TROPOININ, ED    Imaging Review No results found.   EKG Interpretation   Date/Time:  Saturday September 30 2014 19:46:50 EST Ventricular Rate:  54 PR Interval:  176 QRS Duration: 150 QT Interval:  448 QTC Calculation: 424 R Axis:   -45 Text Interpretation:  Sinus bradycardia Left axis deviation Right bundle  branch block Inferior infarct , age undetermined Abnormal ECG agree.  abnormal no STEMI. Confirmed by Donnald Garre, MD, Lebron Conners [phone]) on 09/30/2014  11:32:45 PM      MDM   Final diagnoses:  Pain of right upper arm  New right bundle branch block  Severe comorbid illness   The patient is a challenging historian. At this point time concern is for possible anginal equivalent. The patient describes arm pain that radiates down the medial aspect of his arm and stops  just above his elbow. I cannot reproduce it mechanically. He has multiple cardiac risk factors and a new appearing right bundle branch block on EKG. At this point time the patient will be admitted for further evaluation and workup for arm pain with abnormal EKG and possible anginal equivalent.    Arby Barrette, MD 10/01/14 2157

## 2014-09-30 NOTE — H&P (Signed)
PCP:  Abigail Miyamoto, MD  Cardiology [facility] Grumbling  Chief Complaint:  Right arm pain  HPI: Scott Richardson is a 59 y.o. male   has a past medical history of Chest pain; HTN (hypertension); Obesity; HLD (hyperlipidemia); LVH (left ventricular hypertrophy); Stroke; and Noncompliance.   Presented with  Patient have had a CVA in March 2015 he have had some memory deficits since then. Patient woke up this Am with right  Arm pain, non radiating, associated with mild SOB. Pain was not positional not worse with activity. The pain got better with stretching. Family brought him to ER to get checked out. Because the pain was coming and going. He have had a MyoView on 06/28/14 showing no moderately fixed defect consistent with a scar with mildly reduced LVF. There is no ischemia. His 2D echo thought showed normal LVF.  In ER he was noted to have new onset RBBB. Patient denies any chest pain trop negative  Hospitalist was called for admission for right arm pain being evaluated for possible angina equivalent.   Review of Systems:    Pertinent positives include:  Right arm pain memory problems shortness of breath at rest.  Constitutional:  No weight loss, night sweats, Fevers, chills, fatigue, weight loss  HEENT:  No headaches, Difficulty swallowing,Tooth/dental problems,Sore throat,  No sneezing, itching, ear ache, nasal congestion, post nasal drip,  Cardio-vascular:  No chest pain, Orthopnea, PND, anasarca, dizziness, palpitations.no Bilateral lower extremity swelling  GI:  No heartburn, indigestion, abdominal pain, nausea, vomiting, diarrhea, change in bowel habits, loss of appetite, melena, blood in stool, hematemesis Resp:  no No dyspnea on exertion, No excess mucus, no productive cough, No non-productive cough, No coughing up of blood.No change in color of mucus.No wheezing. Skin:  no rash or lesions. No jaundice GU:  no dysuria, change in color of urine, no urgency or frequency. No  straining to urinate.  No flank pain.  Musculoskeletal:  No joint pain or no joint swelling. No decreased range of motion. No back pain.  Psych:  No change in mood or affect. No depression or anxiety. No memory loss.  Neuro: no localizing neurological complaints, no tingling, no weakness, no double vision, no gait abnormality, no slurred speech, no confusion  Otherwise ROS are negative except for above, 10 systems were reviewed  Past Medical History: Past Medical History  Diagnosis Date  . Chest pain   . HTN (hypertension)   . Obesity   . HLD (hyperlipidemia)   . LVH (left ventricular hypertrophy)   . Stroke   . Noncompliance    Past Surgical History  Procedure Laterality Date  . No past surgeries    . Tee without cardioversion N/A 12/01/2013    Procedure: TRANSESOPHAGEAL ECHOCARDIOGRAM (TEE);  Surgeon: Vesta Mixer, MD;  Location: [facility] ENDOSCOPY;  Service: Cardiovascular;  Laterality: N/A;     Medications: Prior to Admission medications   Medication Sig Start Date End Date Taking? Authorizing Provider  aspirin EC 81 MG tablet Take 1 tablet (81 mg total) by mouth daily. 07/21/14  Yes Quintella Reichert, MD  atorvastatin (LIPITOR) 80 MG tablet Take 1 tablet (80 mg total) by mouth daily. 07/21/14  Yes Quintella Reichert, MD  cloNIDine (CATAPRES) 0.1 MG tablet Take 0.1 mg by mouth 2 (two) times daily.   Yes Historical Provider, MD  clopidogrel (PLAVIX) 75 MG tablet Take 1 tablet (75 mg total) by mouth daily. 04/18/14  Yes Ronal Fear, NP  hydrochlorothiazide (HYDRODIURIL) 25 MG tablet  Take 1 tablet (25 mg total) by mouth daily. 12/01/13  Yes Joseph Art, DO  Levothyroxine Sodium 88 MCG CAPS Take 1 capsule (88 mcg total) by mouth daily before breakfast. 12/14/13  Yes Rosalio Macadamia, NP  lisinopril (PRINIVIL,ZESTRIL) 40 MG tablet Take 1 tablet (40 mg total) by mouth daily. 12/14/13  Yes Rosalio Macadamia, NP  metoprolol (LOPRESSOR) 50 MG tablet Take 25 mg by mouth 2 (two) times daily.   Yes  Historical Provider, MD  omeprazole (PRILOSEC) 40 MG capsule Take 40 mg by mouth daily.   Yes Historical Provider, MD  potassium chloride SA (K-DUR,KLOR-CON) 20 MEQ tablet Take 1 tablet (20 mEq total) by mouth daily. 06/19/14  Yes Quintella Reichert, MD  metoprolol tartrate (LOPRESSOR) 25 MG tablet TAKE ONE TABLET BY MOUTH TWICE DAILY Patient not taking: Reported on 09/30/2014    Rosalio Macadamia, NP    Allergies:  No Known Allergies  Social History:  Ambulatory   independently   Lives at home  With family     reports that he has never smoked. He has never used smokeless tobacco. He reports that he does not drink alcohol or use illicit drugs.    Family History: family history includes Blindness in his maternal aunt, maternal aunt, maternal aunt, and maternal aunt; Hypertension in his father and mother.    Physical Exam: Patient Vitals for the past 24 hrs:  BP Pulse Resp SpO2 Height  09/30/14 2330 138/64 mmHg (!) 49 23 99 % -  09/30/14 2300 128/62 mmHg (!) 47 18 97 % -  09/30/14 2230 122/59 mmHg (!) 47 20 97 % -  09/30/14 2215 - (!) 48 20 96 % -  09/30/14 2200 127/58 mmHg (!) 50 25 96 % -  09/30/14 2145 - (!) 49 18 96 % -  09/30/14 2130 (!) 133/53 mmHg (!) 49 23 96 % -  09/30/14 2100 121/61 mmHg (!) 51 18 97 % -  09/30/14 2030 127/56 mmHg (!) 50 19 97 % -  09/30/14 1936 163/73 mmHg (!) 54 18 100 %  (1.727 m)    1. General:  in No Acute distress 2. Psychological: Alert and  Oriented 3. Head/ENT:   Moist  Mucous Membranes                          Head Non traumatic, neck supple                          Normal  Dentition 4. SKIN: normal  Skin turgor,  Skin clean Dry and intact no rash 5. Heart: Regular rate and rhythm no Murmur, Rub or gallop 6. Lungs: Clear to auscultation bilaterally, no wheezes or crackles   7. Abdomen: Soft, non-tender, Non distended 8. Lower extremities: no clubbing, cyanosis, or edema 9. Neurologically Grossly intact, moving all 4 extremities  equally 10. MSK: Normal range of motion  body mass index is unknown because there is no weight on file.   Labs on Admission:   Results for orders placed or performed during the hospital encounter of 09/30/14 (from the past 24 hour(s))  CBC     Status: None   Collection Time: 09/30/14  8:11 PM  Result Value Ref Range   WBC 7.8 4.0 - 10.5 K/uL   RBC 5.08 4.22 - 5.81 MIL/uL   Hemoglobin 15.4 13.0 - 17.0 g/dL   HCT 16.1 09.6 - 04.5 %  MCV 90.2 78.0 - 100.0 fL   MCH 30.3 26.0 - 34.0 pg   MCHC 33.6 30.0 - 36.0 g/dL   RDW 74.1 28.7 - 86.7 %   Platelets 208 150 - 400 K/uL  Basic metabolic panel     Status: Abnormal   Collection Time: 09/30/14  8:11 PM  Result Value Ref Range   Sodium 137 135 - 145 mmol/L   Potassium 3.7 3.5 - 5.1 mmol/L   Chloride 102 96 - 112 mEq/L   CO2 26 19 - 32 mmol/L   Glucose, Bld 138 (H) 70 - 99 mg/dL   BUN 18 6 - 23 mg/dL   Creatinine, Ser 6.72 0.50 - 1.35 mg/dL   Calcium 9.1 8.4 - 09.4 mg/dL   GFR calc non Af Amer 67 (L) >90 mL/min   GFR calc Af Amer 78 (L) >90 mL/min   Anion gap 9 5 - 15  I-stat troponin, ED (not at [facility])     Status: None   Collection Time: 09/30/14  8:22 PM  Result Value Ref Range   Troponin i, poc 0.01 0.00 - 0.08 ng/mL   Comment 3            UA not obtained   Lab Results  Component Value Date   HGBA1C 6.8* 09/05/2014    CrCl cannot be calculated (Unknown ideal weight.).  BNP (last 3 results) No results for input(s): PROBNP in the last 8760 hours.  Other results:  I have pearsonaly reviewed this: ECG REPORT  Rate: 53  Rhythm:SB RBBB  ST&T Change: t waves inversions in lateral leads   There were no vitals filed for this visit.   Cultures: No results found for: SDES, SPECREQUEST, CULT, REPTSTATUS   Radiological Exams on Admission: No results found.  Chart has been reviewed  Assessment/Plan  59 year old gentleman with history of stroke and hypertension presents with right arm pain atypical story but given  new right bundle branch block was worrisome if it was angina equivalent. Patient currently pain-free.. Review of records in September he had essentially negative stress test for ischemia but was supposed to have cardiac CT done the results of this not clear in admitted for cardiac monitoring  Present on Admission:  . RBBB- Will monitor on telemetry. Cycle cardiac enzymes. Given bradycardia will stop clonidine. Decrease dose of metoprolol with an order for holding parameters  . Right arm pain -atypical presentation and short etiology. Currently improved, will cycle cardiac enzymes and monitor on telemetry  . Benign essential HTN - will hold clonidine given bradycardia continue lisinopril and decrease dose of metoprolol with holding parameters   stretch CVA we'll continue Plavix and aspirin  Prophylaxis:   Lovenox, Protonix  CODE STATUS:  FULL CODE   Other plan as per orders.  I have spent a total of 55 min on this admission  Isidor Bromell 09/30/2014, 11:58 PM  [facility]  Pager [phone]   after 2 AM please page floor coverage PA If 7AM-7PM, please contact the day team taking care of the patient  [email or web address]  Password [email or web address]

## 2014-10-01 ENCOUNTER — Encounter (HOSPITAL_COMMUNITY): Payer: Self-pay | Admitting: *Deleted

## 2014-10-01 DIAGNOSIS — I2510 Atherosclerotic heart disease of native coronary artery without angina pectoris: Secondary | ICD-10-CM

## 2014-10-01 DIAGNOSIS — R9431 Abnormal electrocardiogram [ECG] [EKG]: Secondary | ICD-10-CM

## 2014-10-01 DIAGNOSIS — I429 Cardiomyopathy, unspecified: Secondary | ICD-10-CM

## 2014-10-01 DIAGNOSIS — M79621 Pain in right upper arm: Secondary | ICD-10-CM | POA: Insufficient documentation

## 2014-10-01 DIAGNOSIS — M79601 Pain in right arm: Secondary | ICD-10-CM

## 2014-10-01 DIAGNOSIS — I4510 Unspecified right bundle-branch block: Secondary | ICD-10-CM

## 2014-10-01 DIAGNOSIS — R001 Bradycardia, unspecified: Secondary | ICD-10-CM | POA: Diagnosis present

## 2014-10-01 LAB — CBC
HCT: 45.8 % (ref 39.0–52.0)
Hemoglobin: 14.8 g/dL (ref 13.0–17.0)
MCH: 29.6 pg (ref 26.0–34.0)
MCHC: 32.3 g/dL (ref 30.0–36.0)
MCV: 91.6 fL (ref 78.0–100.0)
Platelets: 223 10*3/uL (ref 150–400)
RBC: 5 MIL/uL (ref 4.22–5.81)
RDW: 13.5 % (ref 11.5–15.5)
WBC: 7 10*3/uL (ref 4.0–10.5)

## 2014-10-01 LAB — CREATININE, SERUM
Creatinine, Ser: 1.11 mg/dL (ref 0.50–1.35)
GFR calc Af Amer: 83 mL/min — ABNORMAL LOW (ref >90)
GFR calc non Af Amer: 71 mL/min — ABNORMAL LOW (ref >90)

## 2014-10-01 LAB — TROPONIN I
Troponin I: <0.03 ng/mL (ref <0.031)
Troponin I: <0.03 ng/mL (ref <0.031)

## 2014-10-01 LAB — LIPID PANEL
Cholesterol: 104 mg/dL (ref 0–200)
HDL: 24 mg/dL — AB (ref >39)
LDL Cholesterol: 55 mg/dL (ref 0–99)
TRIGLYCERIDES: 125 mg/dL (ref <150)
Total CHOL/HDL Ratio: 4.3 RATIO
VLDL: 25 mg/dL (ref 0–40)

## 2014-10-01 LAB — HEMOGLOBIN A1C
Hgb A1c MFr Bld: 6.8 % — ABNORMAL HIGH (ref <5.7)
Mean Plasma Glucose: 148 mg/dL — ABNORMAL HIGH (ref <117)

## 2014-10-01 MED ORDER — ENOXAPARIN SODIUM 40 MG/0.4ML ~~LOC~~ SOLN
40.0000 mg | SUBCUTANEOUS | Status: DC
  Administered 2014-10-01: 40 mg via SUBCUTANEOUS
  Filled 2014-10-01: qty 0.4

## 2014-10-01 MED ORDER — ATORVASTATIN CALCIUM 80 MG PO TABS
80.0000 mg | ORAL_TABLET | Freq: Every day | ORAL | Status: DC
  Administered 2014-10-01: 80 mg via ORAL
  Filled 2014-10-01: qty 1

## 2014-10-01 MED ORDER — PANTOPRAZOLE SODIUM 40 MG PO TBEC
40.0000 mg | DELAYED_RELEASE_TABLET | Freq: Every day | ORAL | Status: DC
  Administered 2014-10-01: 40 mg via ORAL
  Filled 2014-10-01: qty 1

## 2014-10-01 MED ORDER — METOPROLOL TARTRATE 12.5 MG HALF TABLET: 12.5000 mg | ORAL_TABLET | Freq: Two times a day (BID) | ORAL | Status: DC

## 2014-10-01 MED ORDER — MORPHINE SULFATE 2 MG/ML IJ SOLN: 2.0000 mg | INTRAMUSCULAR | Status: DC | PRN

## 2014-10-01 MED ORDER — CLOPIDOGREL BISULFATE 75 MG PO TABS
75.0000 mg | ORAL_TABLET | Freq: Every day | ORAL | Status: DC
  Administered 2014-10-01: 75 mg via ORAL
  Filled 2014-10-01 (×2): qty 1

## 2014-10-01 MED ORDER — METOPROLOL TARTRATE 25 MG PO TABS: 12.5000 mg | ORAL_TABLET | Freq: Two times a day (BID) | ORAL | Status: DC

## 2014-10-01 MED ORDER — LISINOPRIL 20 MG PO TABS
40.0000 mg | ORAL_TABLET | Freq: Every day | ORAL | Status: DC
  Administered 2014-10-01: 40 mg via ORAL
  Filled 2014-10-01: qty 2

## 2014-10-01 MED ORDER — ACETAMINOPHEN 325 MG PO TABS: 650.0000 mg | ORAL_TABLET | ORAL | Status: DC | PRN

## 2014-10-01 MED ORDER — LEVOTHYROXINE SODIUM 88 MCG PO TABS
88.0000 ug | ORAL_TABLET | Freq: Every day | ORAL | Status: DC
  Administered 2014-10-01: 88 ug via ORAL
  Filled 2014-10-01 (×2): qty 1

## 2014-10-01 MED ORDER — ASPIRIN EC 81 MG PO TBEC
81.0000 mg | DELAYED_RELEASE_TABLET | Freq: Every day | ORAL | Status: DC
Start: 2014-10-01 — End: 2014-10-01
  Administered 2014-10-01: 81 mg via ORAL
  Filled 2014-10-01: qty 1

## 2014-10-01 MED ORDER — ONDANSETRON HCL 4 MG/2ML IJ SOLN: 4.0000 mg | Freq: Four times a day (QID) | INTRAMUSCULAR | Status: DC | PRN

## 2014-10-01 NOTE — Discharge Summary (Signed)
Physician Discharge Summary  Scott Richardson NWG:[id number] DOB: 03-18-1956 DOA: 09/30/2014  PCP: Abigail Miyamoto, MD  Admit date: 09/30/2014 Discharge date: 10/01/2014  Time spent: 65 minutes  Recommendations for Outpatient Follow-up:  1. Follow-up with Dr. Armanda Magic in 2 weeks. 2. Follow-up with Abigail Miyamoto, MD in 1 week. All follow-up patient need a basic metabolic profile done to follow-up electrolytes and renal function. Patient's blood pressure needs to be reassessed, as patient's clonidine was discontinued secondary to bradycardia and metoprolol dose decreased.  Discharge Diagnoses:  Principal Problem:   Right arm pain Active Problems:   New right bundle branch block   Dyslipidemia   Cardiomyopathy- 30-35% -etiology not yet determined    Abnormal EKG- LVH with repol changes   History of Hypertension with HTN CVD   Benign essential HTN   OSA (obstructive sleep apnea)   RBBB   Chest pain   Bradycardia   Discharge Condition: Stable and improved.  Diet recommendation: Heart healthy diet  Filed Weights   10/01/14 0101  Weight: 117.164 kg (258 lb 4.8 oz)    History of present illness:  Scott Richardson is a 59 y.o. male   has a past medical history of Chest pain; HTN (hypertension); Obesity; HLD (hyperlipidemia); LVH (left ventricular hypertrophy); Stroke; and Noncompliance.   Presented with  Patient have had a CVA in March 2015 he have had some memory deficits since then. Patient woke up on the morning of admission with right Arm pain, non radiating, associated with mild SOB. Pain was not positional not worse with activity. The pain got better with stretching. Family brought him to ER to get checked out. Because the pain was coming and going. He had a MyoView on 06/28/14 showing moderately fixed defect consistent with a scar with mildly reduced LVF. There is no ischemia. His 2D echo thought showed normal LVF.  In ER he was noted to have new onset RBBB. Patient  denied any chest pain trop negative  Hospitalist was called for admission for right arm pain being evaluated for possible angina equivalent.    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 Patient was admitted cardiac enzymes were cycled which were negative 3. Patient's right arm pain resolved. Cardiology consultation was obtained and patient was seen in consultation by Dr. Elease Hashimoto who felt patient's right arm pain was not related to cardiac ischemia. Patient had had a stress Myoview study done which revealed a fixed inferior defect. CT angiogram cardiac was done which revealed an occlusion of his right coronary artery. Patient had less than 50% stenosis in his left system. It was recommended that patient continue aggressive medical therapy and cholesterol lowering. Patient improved clinically ambulated without any complaints and will be discharged in stable and improved condition. Patient will follow-up with PCP and cardiologist as outpatient.  #2 new right bundle branch block Patient was noted on admission on EKG to have a new right bundle branch block. Cardiac enzymes were cycled which are negative 3. Patient did not have any acute chest pain however did have right upper extremity pain which resolved. Cardiology consultation was obtained and it was felt this was not an anginal equivalent. It was felt this was not increasing ischemia. Patient did not have any dizziness and the was no  evidence of worsening heart block. Was recommended and no further cardiac workup was needed. Patient is to follow-up with cardiologist as outpatient.  #3 coronary artery disease Patient had a Myoview stress study done which revealed a fixed inferior defect. Cardiac CT angiogram which  was done showed an occlusion of his right coronary artery and less than 50% stenosis in the left system. It was felt no cardiac further workup was needed at this time except for risk factor modification. Cardiac enzymes which was cycled were negative 3. Patient is to follow-up with his cardiologist as outpatient.  #4 bradycardia Patient was noted to be bradycardic during that admission. Patient's clonidine was discontinued and his metoprolol dose decreased. Patient's bradycardia improved. Patient will need to follow-up with PCP and cardiologist as outpatient.  #5 hypertension Remained stable during the hospitalization. Patient was maintained on lisinopril and a decreased dose of metoprolol.  Procedures:  None  Consultations:  Cardiology : Dr Elease Hashimoto 10/01/14  Discharge Exam: Filed Vitals:   10/01/14 1358  BP: 146/66  Pulse: 59  Temp: 97.5 F (36.4 C)  Resp: 20    General: NAD Cardiovascular: RRR Respiratory: CTAB  Discharge Instructions   Discharge Instructions    Diet - low sodium heart healthy    Complete by:  As directed      Discharge instructions    Complete by:  As directed   Follow up with Abigail Miyamoto, MD in 1 week. Follow up with Dr Mayford Knife, cardiology in 2 weeks.     Increase activity slowly    Complete by:  As directed           Current Discharge Medication List    CONTINUE these medications which have CHANGED   Details  metoprolol tartrate (LOPRESSOR) 12.5 mg TABS tablet Take 0.5 tablets (12.5 mg total) by mouth 2 (two) times daily. Qty: 60 tablet, Refills: 0      CONTINUE these medications which have NOT CHANGED   Details  aspirin EC 81 MG tablet Take 1 tablet (81 mg total) by mouth daily. Qty: 90 tablet, Refills: 3    atorvastatin (LIPITOR) 80 MG tablet Take 1 tablet (80 mg total) by mouth daily. Qty: 30 tablet, Refills: 5    clopidogrel (PLAVIX) 75 MG tablet Take 1 tablet (75 mg total) by mouth daily. Qty: 90 tablet, Refills: 3     hydrochlorothiazide (HYDRODIURIL) 25 MG tablet Take 1 tablet (25 mg total) by mouth daily. Qty: 30 tablet, Refills: 0    Levothyroxine Sodium 88 MCG CAPS Take 1 capsule (88 mcg total) by mouth daily before breakfast. Qty: 30 capsule, Refills: 0    lisinopril (PRINIVIL,ZESTRIL) 40 MG tablet Take 1 tablet (40 mg total) by mouth daily. Qty: 90 tablet, Refills: 3   Associated Diagnoses: HTN (hypertension); CVA (cerebral vascular accident); Noncompliance    omeprazole (PRILOSEC) 40 MG capsule Take 40 mg by mouth daily.    potassium chloride SA (K-DUR,KLOR-CON) 20 MEQ tablet Take 1 tablet (20 mEq total) by mouth daily. Qty: 30 tablet, Refills: 3      STOP taking these medications     cloNIDine (CATAPRES) 0.1 MG tablet        No Known Allergies Follow-up Information    Follow up with PERRY,LAWRENCE EDWARD, MD. Schedule an appointment as soon as possible for a visit in 1 week.   Specialty:  Family Medicine   Contact information:   [address] [place] [address] [phone]       Follow up  with Quintella Reichert, MD. Schedule an appointment as soon as possible for a visit in 2 weeks.   Specialty:  Cardiology   Contact information:   [address] [place] [address] [phone]        The results of significant diagnostics from this hospitalization (including imaging, microbiology, ancillary and laboratory) are listed below for reference.    Significant Diagnostic Studies: No results found.  Microbiology: No results found for this or any previous visit (from the past 240 hour(s)).   Labs: Basic Metabolic Panel:  Recent Labs Lab 09/30/14 2011 10/01/14 0139  NA 137  --   K 3.7  --   CL 102  --   CO2 26  --   GLUCOSE 138*  --   BUN 18  --   CREATININE 1.17 1.11  CALCIUM 9.1  --    Liver Function Tests: No results for input(s): AST, ALT, ALKPHOS, BILITOT, PROT, ALBUMIN in the last 168 hours. No results for input(s): LIPASE, AMYLASE in the  last 168 hours. No results for input(s): AMMONIA in the last 168 hours. CBC:  Recent Labs Lab 09/30/14 2011 10/01/14 0139  WBC 7.8 7.0  HGB 15.4 14.8  HCT 45.8 45.8  MCV 90.2 91.6  PLT 208 223   Cardiac Enzymes:  Recent Labs Lab 10/01/14 0139 10/01/14 0328 10/01/14 0710  TROPONINI <0.03 <0.03 <0.03   BNP: BNP (last 3 results) No results for input(s): PROBNP in the last 8760 hours. CBG: No results for input(s): GLUCAP in the last 168 hours.     SignedRamiro Harvest MD [facility] 10/01/2014, 3:41 PM

## 2014-10-01 NOTE — Progress Notes (Signed)
UR completed 

## 2014-10-01 NOTE — Consult Note (Signed)
CONSULT NOTE  Date: 10/01/2014               Patient Name:  Scott Richardson MRN: [id number]  DOB: 08/01/1956 Age / Sex: 59 y.o., male        PCP: PERRY,LAWRENCE EDWARD Primary Cardiologist: Mayford Knife            Referring Physician: Janee Morn              Reason for Consult: Right arm pain and RBBB            History of Present Illness: Patient is a 59 y.o. male with a PMHx of HTN, HLD and obesity. His past echo from 2009 showed an EF of 65%. He stopped all of his BP medicines for at least 9 months (due to cost)- presented with acute stroke with associated encephalopathy in the setting of hypertensive crisis in March of 2015. TEE showed EF down to 30 to 35%. 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  He was noted to have a new RBBB .Marland Kitchen   He had right shoulder pain.  Possibly related to driving. Lasted for 2 hours. Not associated with CP,dyspnea, dizziness,  Described as an ache   Eventually resolved.  No pain this am troponins are negative.    Medications: Outpatient medications: Prescriptions prior to admission  Medication Sig Dispense Refill Last Dose  . aspirin EC 81 MG tablet Take 1 tablet (81 mg total) by mouth daily. 90 tablet 3 09/30/2014 at Unknown time  . atorvastatin (LIPITOR) 80 MG tablet Take 1 tablet (80 mg total) by mouth daily. 30 tablet 5 09/30/2014 at Unknown time  . cloNIDine (CATAPRES) 0.1 MG tablet Take 0.1 mg by mouth 2 (two) times daily.   09/30/2014 at Unknown time  . clopidogrel (PLAVIX) 75 MG tablet Take 1 tablet (75 mg total) by mouth daily. 90 tablet 3 09/30/2014 at Unknown time  . hydrochlorothiazide (HYDRODIURIL) 25 MG tablet Take 1 tablet (25 mg total) by mouth daily. 30 tablet 0 09/30/2014 at Unknown time  . Levothyroxine Sodium 88 MCG CAPS Take 1 capsule (88 mcg total) by mouth daily before breakfast. 30 capsule 0 09/30/2014 at Unknown time  . lisinopril (PRINIVIL,ZESTRIL) 40 MG tablet Take 1  tablet (40 mg total) by mouth daily. 90 tablet 3 09/30/2014 at Unknown time  . metoprolol (LOPRESSOR) 50 MG tablet Take 25 mg by mouth 2 (two) times daily.   09/30/2014 at 8am  . omeprazole (PRILOSEC) 40 MG capsule Take 40 mg by mouth daily.   09/29/2014 at Unknown time  . potassium chloride SA (K-DUR,KLOR-CON) 20 MEQ tablet Take 1 tablet (20 mEq total) by mouth daily. 30 tablet 3 09/30/2014 at Unknown time  . metoprolol tartrate (LOPRESSOR) 25 MG tablet TAKE ONE TABLET BY MOUTH TWICE DAILY (Patient not taking: Reported on 09/30/2014) 180 tablet 0 Not Taking at Unknown time    Current medications: Current Facility-Administered Medications  Medication Dose Route Frequency Provider Last Rate Last Dose  . acetaminophen (TYLENOL) tablet 650 mg  650 mg Oral Q4H PRN Therisa Doyne, MD      . aspirin EC tablet 81 mg  81 mg Oral Daily Therisa Doyne, MD      . atorvastatin (LIPITOR) tablet 80 mg  80 mg Oral Daily Therisa Doyne, MD      . clopidogrel (PLAVIX) tablet 75 mg  75 mg Oral Q breakfast Therisa Doyne, MD  75 mg at 10/01/14 0736  . enoxaparin (LOVENOX) injection 40 mg  40 mg Subcutaneous Q24H Therisa Doyne, MD      . levothyroxine (SYNTHROID, LEVOTHROID) tablet 88 mcg  88 mcg Oral QAC breakfast Therisa Doyne, MD   88 mcg at 10/01/14 0736  . lisinopril (PRINIVIL,ZESTRIL) tablet 40 mg  40 mg Oral Daily Therisa Doyne, MD      . Melene Muller ON 10/02/2014] metoprolol tartrate (LOPRESSOR) tablet 12.5 mg  12.5 mg Oral BID Therisa Doyne, MD      . morphine 2 MG/ML injection 2 mg  2 mg Intravenous Q2H PRN Therisa Doyne, MD      . ondansetron (ZOFRAN) injection 4 mg  4 mg Intravenous Q6H PRN Therisa Doyne, MD      . pantoprazole (PROTONIX) EC tablet 40 mg  40 mg Oral Daily Therisa Doyne, MD         No Known Allergies   Past Medical History  Diagnosis Date  . Chest pain   . HTN (hypertension)   . Obesity   . HLD (hyperlipidemia)   . LVH (left ventricular  hypertrophy)   . Stroke   . Noncompliance     Past Surgical History  Procedure Laterality Date  . No past surgeries    . Tee without cardioversion N/A 12/01/2013    Procedure: TRANSESOPHAGEAL ECHOCARDIOGRAM (TEE);  Surgeon: Vesta Mixer, MD;  Location: [facility] END[facility];  Service: Cardiovascular;  Laterality: N/A;    Family History  Problem Relation Age of Onset  . Hypertension Mother   . Hypertension Father   . Blindness Maternal Aunt   . Blindness Maternal Aunt   . Blindness Maternal Aunt   . Blindness Maternal Aunt     Social History:  reports that he has never smoked. He has never used smokeless tobacco. He reports that he does not drink alcohol or use illicit drugs.   Review of Systems: Constitutional:  denies fever, chills, diaphoresis, appetite change and fatigue.  HEENT: denies photophobia, eye pain, redness, hearing loss, ear pain, congestion, sore throat, rhinorrhea, sneezing, neck pain, neck stiffness and tinnitus.  Respiratory: denies SOB, DOE, cough, chest tightness, and wheezing.  Cardiovascular: denies chest pain, palpitations and leg swelling.  Gastrointestinal: denies nausea, vomiting, abdominal pain, diarrhea, constipation, blood in stool.  Genitourinary: denies dysuria, urgency, frequency, hematuria, flank pain and difficulty urinating.  Musculoskeletal: admits to  Right arm pain   Skin: denies pallor, rash and wound.  Neurological: denies dizziness, seizures, syncope, weakness, light-headedness, numbness and headaches.   Hematological: denies adenopathy, easy bruising, personal or family bleeding history.  Psychiatric/ Behavioral: denies suicidal ideation, mood changes, confusion, nervousness, sleep disturbance and agitation.    Physical Exam: BP 139/59 mmHg  Pulse 47  Temp(Src) 98.2 F (36.8 C) (Oral)  Resp 20  Ht  (1.702 m)  Wt 258 lb 4.8 oz (117.164 kg)  BMI 40.45 kg/m2  SpO2 91%  Wt Readings from Last 3 Encounters:  10/01/14 258 lb 4.8 oz  (117.164 kg)  08/06/14 261 lb (118.389 kg)  07/04/14 261 lb (118.389 kg)    General: Vital signs reviewed and noted. Well-developed, well-nourished, in no acute distress; alert,   Head: Normocephalic, atraumatic, sclera anicteric,   Neck: Supple. Negative for carotid bruits. No JVD   Lungs:  Clear bilaterally, no  wheezes, rales, or rhonchi. Breathing is normal   Heart: RRR with S1 S2. No murmurs, rubs, or gallops   Abdomen:  Soft, non-tender, non-distended with normoactive bowel sounds. No hepatomegaly. No  rebound/guarding. No obvious abdominal masses   MSK: Strength and the appear normal for age.   No right shoulder tenderness.   Extremities: No clubbing or cyanosis. No edema.  Distal pedal pulses are 2+ and equal   Neurologic: Alert and oriented X 3. Moves all extremities spontaneously.  Psych: Responds to questions appropriately with a normal affect.     Lab results: Basic Metabolic Panel:  Recent Labs Lab 09/30/14 2011 10/01/14 0139  NA 137  --   K 3.7  --   CL 102  --   CO2 26  --   GLUCOSE 138*  --   BUN 18  --   CREATININE 1.17 1.11  CALCIUM 9.1  --     Liver Function Tests: No results for input(s): AST, ALT, ALKPHOS, BILITOT, PROT, ALBUMIN in the last 168 hours. No results for input(s): LIPASE, AMYLASE in the last 168 hours. No results for input(s): AMMONIA in the last 168 hours.  CBC:  Recent Labs Lab 09/30/14 2011 10/01/14 0139  WBC 7.8 7.0  HGB 15.4 14.8  HCT 45.8 45.8  MCV 90.2 91.6  PLT 208 223    Cardiac Enzymes:  Recent Labs Lab 10/01/14 0139 10/01/14 0328 10/01/14 0710  TROPONINI <0.03 <0.03 <0.03    BNP: Invalid input(s): POCBNP  CBG: No results for input(s): GLUCAP in the last 168 hours.  Coagulation Studies: No results for input(s): LABPROT, INR in the last 72 hours.   Other results:  EKG :  NSR , RBBB    Imaging:  No results found.    Assessment & Plan:   1. Right arm pain:  His troponin  levels are negative.  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  At this point I do not think that the right arm pain is related to cardiac ischemia.  I would ambulate him.  . If he's able to make several laps around the ward  that I think that he could be discharged safely.  2. Coronary artery disease: The patient had a stress Myoview study which revealed a fixed inferior defect. He did not have any anterior or lateral to ischemia. He has CT and gram which revealed an occlusion of his right coronary artery. He had less than 50% stenosis in his left system. We'll continue with aggressive medical therapy and cholesterol lowering. His cardiac enzymes are negative. I do not think that this current episode represents an acute cornea syndrome.  3.  Right bundle branch block: The patient was incidentally noted have a right bundle branch block. At this point I don't think that this represents any increasing ischemia. This can be evaluated in the future. He's not had any dizziness and there is no evidence of worsening heart block.    Vesta Mixer, Montez Hageman., MD, [facility] 10/01/2014, 9:06 AM Office - [phone] Pager [phone]

## 2014-10-06 ENCOUNTER — Ambulatory Visit (INDEPENDENT_AMBULATORY_CARE_PROVIDER_SITE_OTHER): Payer: Self-pay | Admitting: Cardiology

## 2014-10-06 ENCOUNTER — Encounter: Payer: Self-pay | Admitting: Cardiology

## 2014-10-06 VITALS — BP 166/88 | HR 56 | Ht 68.0 in | Wt 262.4 lb

## 2014-10-06 DIAGNOSIS — I43 Cardiomyopathy in diseases classified elsewhere: Secondary | ICD-10-CM

## 2014-10-06 DIAGNOSIS — E785 Hyperlipidemia, unspecified: Secondary | ICD-10-CM

## 2014-10-06 DIAGNOSIS — G4733 Obstructive sleep apnea (adult) (pediatric): Secondary | ICD-10-CM

## 2014-10-06 DIAGNOSIS — I429 Cardiomyopathy, unspecified: Secondary | ICD-10-CM

## 2014-10-06 DIAGNOSIS — I119 Hypertensive heart disease without heart failure: Secondary | ICD-10-CM

## 2014-10-06 DIAGNOSIS — M79601 Pain in right arm: Secondary | ICD-10-CM

## 2014-10-06 DIAGNOSIS — I10 Essential (primary) hypertension: Secondary | ICD-10-CM

## 2014-10-06 DIAGNOSIS — R001 Bradycardia, unspecified: Secondary | ICD-10-CM

## 2014-10-06 MED ORDER — AMLODIPINE BESYLATE 2.5 MG PO TABS: 2.5000 mg | ORAL_TABLET | Freq: Every day | ORAL | Status: DC

## 2014-10-06 NOTE — Patient Instructions (Signed)
Your physician has recommended you make the following change in your medication:  1) START Amlodipine 2.5 mg daily.  Please check your blood pressure daily for 1 week and call us with results. [phone].  Your physician wants you to follow-up in: 6 months with Dr. Mayford Knife. You will receive a reminder letter in the mail two months in advance. If you don't receive a letter, please call our office to schedule the follow-up appointment.

## 2014-10-06 NOTE — Progress Notes (Signed)
[address] [place], [place]  [address] Phone: [phone] Fax:  [phone]  Date:  10/06/2014   ID:  Scott Richardson, DOB November 05, 1955, MRN [id number]  PCP:  Abigail Miyamoto, MD  Cardiologist:  Armanda Magic, MD    History of Present Illness: Mr. Route comes back today for followup. Due to poorly controlled HTN he was set up for PSG which showed mild OSA at 11/hr and he was set up for CPAP titration. He was titrated to 13cm H2o and now presents today for followup.  He apparently has no insurance so he has not been set up for his CPAP. He also was recently in [facility] due to right arm pain.  This was exacerbated by movement and by palpation of his right shoulder.  Cardiology was consulted and felt not to be cardiac in origin.  He has a history of ASCAD coronary CTA with occluded RCA with distal collaterals and otherwise nonobstructive disease.  He has not had any angina.  He has a history of hypertensive DCM which resolved on aggressive medical therapy of his HTN.  He has continued to have the right arm pain and his wife wanted him reevaluated by Cardiology.  He denies any chest pain or pressure, SOB, DOE, LE edema.  He is tender to palpation over the right shoulder and right arm pain recreated with movement.    Wt Readings from Last 3 Encounters:  10/06/14 262 lb 6.4 oz (119.024 kg)  10/01/14 258 lb 4.8 oz (117.164 kg)  08/06/14 261 lb (118.389 kg)     Past Medical History  Diagnosis Date  . Chest pain   . HTN (hypertension)   . Obesity   . HLD (hyperlipidemia)   . LVH (left ventricular hypertrophy)   . Stroke   . Noncompliance     Current Outpatient Prescriptions  Medication Sig Dispense Refill  . aspirin EC 81 MG tablet Take 1 tablet (81 mg total) by mouth daily. 90 tablet 3  . atorvastatin (LIPITOR) 80 MG tablet Take 1 tablet (80 mg total) by mouth daily. 30 tablet 5  . clopidogrel (PLAVIX) 75 MG tablet Take 1 tablet (75 mg total) by mouth daily. 90 tablet 3  .  hydrochlorothiazide (HYDRODIURIL) 25 MG tablet Take 1 tablet (25 mg total) by mouth daily. 30 tablet 0  . Levothyroxine Sodium 88 MCG CAPS Take 1 capsule (88 mcg total) by mouth daily before breakfast. 30 capsule 0  . lisinopril (PRINIVIL,ZESTRIL) 40 MG tablet Take 1 tablet (40 mg total) by mouth daily. 90 tablet 3  . metoprolol tartrate (LOPRESSOR) 12.5 mg TABS tablet Take 0.5 tablets (12.5 mg total) by mouth 2 (two) times daily. 60 tablet 0  . omeprazole (PRILOSEC) 40 MG capsule Take 40 mg by mouth daily.    . potassium chloride SA (K-DUR,KLOR-CON) 20 MEQ tablet Take 1 tablet (20 mEq total) by mouth daily. 30 tablet 3   No current facility-administered medications for this visit.    Allergies:   No Known Allergies  Social History:  The patient  reports that he has never smoked. He has never used smokeless tobacco. He reports that he does not drink alcohol or use illicit drugs.   Family History:  The patient's family history includes Blindness in his maternal aunt, maternal aunt, maternal aunt, and maternal aunt; Hypertension in his father and mother.   ROS:  Please see the history of present illness.      All other systems reviewed and negative.  PHYSICAL EXAM: VS:  BP 166/88 mmHg  Pulse 56  Ht 5\' 8"  (1.727 m)  Wt 262 lb 6.4 oz (119.024 kg)  BMI 39.91 kg/m2  SpO2 98% Well nourished, well developed, in no acute distress HEENT: normal Neck: no JVD Cardiac:  normal S1, S2; RRR; no murmur Lungs:  clear to auscultation bilaterally, no wheezing, rhonchi or rales Abd: soft, nontender, no hepatomegaly Ext: no edema Skin: warm and dry Neuro:  CNs 2-12 intact, no focal abnormalities noted      ASSESSMENT AND PLAN:  1. Mild OSA titrated to CPAP at 13cm H2O but he has not gotten his device due to not having insurance. I will talk to [facility] about setting this up.  2. HTN borderline controlled.  Continue HCTZ/ACE I/BB.  I will add on Amlodipine 2.5mg  daily.  I have asked him to check his BP  daily for a week and call with the results.   3. Obesity 4. Right arm pain- he was recently evaluated in [facility] by Cardiology and felt not to be cardiac.  It is clearly reproducible with palpation over his left shoulder and with certain movements of his arm.  This is consistent with musculoskeletal etiology.  I have recommended that he followup with his PCP next week.  5. ASCAD with occluded proximal RCA with distal collaterals and evidence of nonosbstructive disease elsewhere with no angina.  Continue ASA/Plavix and statin.  He knows to call if he develops chest pain or SOB.   6. 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

## 2014-10-23 ENCOUNTER — Ambulatory Visit: Payer: Self-pay | Admitting: Neurology

## 2014-10-25 ENCOUNTER — Ambulatory Visit (INDEPENDENT_AMBULATORY_CARE_PROVIDER_SITE_OTHER): Payer: Self-pay | Admitting: Neurology

## 2014-10-25 ENCOUNTER — Encounter: Payer: Self-pay | Admitting: Neurology

## 2014-10-25 VITALS — BP 130/75 | HR 65 | Ht 68.0 in | Wt 263.2 lb

## 2014-10-25 DIAGNOSIS — G4733 Obstructive sleep apnea (adult) (pediatric): Secondary | ICD-10-CM

## 2014-10-25 DIAGNOSIS — I63312 Cerebral infarction due to thrombosis of left middle cerebral artery: Secondary | ICD-10-CM

## 2014-10-25 DIAGNOSIS — E669 Obesity, unspecified: Secondary | ICD-10-CM

## 2014-10-25 DIAGNOSIS — E785 Hyperlipidemia, unspecified: Secondary | ICD-10-CM

## 2014-10-25 DIAGNOSIS — F329 Major depressive disorder, single episode, unspecified: Secondary | ICD-10-CM

## 2014-10-25 DIAGNOSIS — F32A Depression, unspecified: Secondary | ICD-10-CM

## 2014-10-25 DIAGNOSIS — I10 Essential (primary) hypertension: Secondary | ICD-10-CM | POA: Insufficient documentation

## 2014-10-25 MED ORDER — SERTRALINE HCL 50 MG PO TABS: 50.0000 mg | ORAL_TABLET | Freq: Every day | ORAL | Status: DC

## 2014-10-25 NOTE — Progress Notes (Signed)
STROKE NEUROLOGY FOLLOW UP NOTE  NAME: Elyjah Hazan DOB: Dec 18, 1955  REASON FOR VISIT: stroke follow up HISTORY FROM: chart and wife  Today we had the pleasure of seeing Jaevion Goto in follow-up at our Neurology Clinic. Pt was accompanied by wife.   History Summary Damari Suastegui is an 59 y.o. male with PMH of hypertension, hyperlipidemia, OSA not on CPAP, CAD, obesity was admitted in [facility] in 11/2013 for left BG and corona radiata infarct on MRI. At that time, his blood pressure was not in good controlled with BP 222/91. MRA of the brain showed moderate intracranial stenosis. Carotid dopplers were performed showing no ICA stenosis. TEE showed no thrombus, no ASD or PFO but EF approximately 30%. LDL 130, A1c 6.2.He was discharged with Plavix and statin.   He was again presented to ED in 02/2014 due to transient difficulty with speech and right facial droop. Had MRI repeat showed possible small acute infarct surrounding the previous stroke in March. Repeat 2-D echo at that time showed ejection fraction 55-60%. His symptoms resolved and he was discharged from ER.  04/18/14 follow up (LL) - during the interval time, patient was doing fine without recurrent stroke-like symptoms. However, since stroke, wife feels like he is cognitively slower and more confused. 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 Since the stroke he has not been driving and was recommended by his PCP to file for disability which he has not done yet. Since discharge he attended outpatient speech therapy. He and his wife states that he has been consistently taking all of his medications since discharge. Blood pressure in the office today is 132/74. He has been scheduled for a sleep study to evaluate for obstructive sleep apnea. Wife states that since TEE his voice has been soft and hoarse, making it difficult to  understand him.  Interval History During the interval time, the patient has been doing the same. Patient denies any recurrent stroke-like symptoms. He was recently admitted to [facility] early this month for right shoulder pain, had cardiology consult, ruled out cardiac etiology. He does have CAD with RCA occlusion and he was put on dural antiplatelet. He followed with Dr. Mayford Knife two weeks ago and was stable from cardiology standpoint. However, wife still complains that he has memory issues and not remember well after stroke. By my talking to them, it seems that he has depressive mood, lack of motivation, sedentary life style which is much different from prior to stroke. He denies any SI or HI.    REVIEW OF SYSTEMS: Full 14 system review of systems performed and notable only for those listed below and in HPI above, all others are negative:  Constitutional: N/A  Cardiovascular: N/A  Ear/Nose/Throat: N/A  Skin: N/A  Eyes: N/A  Respiratory: N/A  Gastroitestinal: N/A  Genitourinary: N/A Hematology/Lymphatic: N/A  Endocrine: N/A  Musculoskeletal: Aching muscles  Allergy/Immunology: N/A  Neurological: memory loss  Psychiatric: N/A Sleep: snoring  The following represents the patient's updated allergies and side effects list: No Known Allergies  The neurologically relevant items on the patient's problem list were reviewed on today's visit.  Neurologic Examination  A problem focused neurological exam (12 or more points of the single system neurologic examination, vital signs counts as 1 point, cranial nerves count for 8 points) was performed.  Blood pressure 130/75, pulse 65, height  (1.727 m), weight 263 lb 3.2  oz (119.387 kg).  General - Well nourished, well developed, in no apparent distress.  Ophthalmologic - Sharp disc margins OU.  Cardiovascular - Regular rate and rhythm with no murmur.  Mental Status -  Level of arousal and orientation to time, place, and person were  intact. Language including expression, naming, repetition, comprehension, reading, and writing was assessed and found intact. Attention span and concentration were normal. Recent and remote memory were intact with delayed recall 3/3. Fund of Knowledge was assessed and was impaired and does not know previous presidents.  Cranial Nerves II - XII - II - Visual field intact OU. III, IV, VI - Extraocular movements intact. V - Facial sensation intact bilaterally. VII - Facial movement intact bilaterally. VIII - Hearing & vestibular intact bilaterally. X - Palate elevates symmetrically. XI - Chin turning & shoulder shrug intact bilaterally. XII - Tongue protrusion intact.  Motor Strength - The patient's strength was normal in all extremities and pronator drift was absent.  Bulk was normal and fasciculations were absent.   Motor Tone - Muscle tone was assessed at the neck and appendages and was normal.  Reflexes - The patient's reflexes were normal in all extremities and he had no pathological reflexes.  Sensory - Light touch, temperature/pinprick, vibration and proprioception, and Romberg testing were assessed and were normal.    Coordination - The patient had normal movements in the hands and feet with no ataxia or dysmetria.  Tremor was absent.  Gait and Station - The patient's transfers, posture, gait, station, and turns were observed as normal.  Data reviewed: I personally reviewed the images and agree with the radiology interpretations.  MRI and MRA 11/2013 1. Acute nonhemorrhagic infarct involving the left lentiform nucleus extending to the left caudate. 2. 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 3. 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 4. Mild narrowing of the left superior division left M2 segment. 5. High-grade stenosis of the  proximal left A1 segment. This corresponds with the area of infarction.  MRI 02/2014 1. Extension of left basal ganglia infarct with areas of restricted diffusion along the lateral margin of the previous infarct territory. 2. Evidence for wallerian degeneration.  CUS 11/2013 - Bilateral: 1-39% ICA stenosis. Vertebral artery flow is antegrade.  TEE 11/2013 - - Left ventricle: Systolic function was moderately to severely reduced. The estimated ejection fraction was in the range of 30% to 35%. - Aortic valve: No evidence of vegetation. - Mitral valve: Mild to moderate regurgitation. - Left atrium: No evidence of thrombus in the atrial cavity or appendage. - Atrial septum: No defect or patent foramen ovale was identified. - Tricuspid valve: No evidence of vegetation. - Pulmonic valve: No evidence of vegetation.  TTE 02/2014 - Left ventricle: The cavity size was normal. There was moderate concentric hypertrophy. Systolic function was normal. The estimated ejection fraction was in the range of 55% to 60%. Wall motion was normal; there were no regional wall motion abnormalities. Doppler parameters are consistent with abnormal left ventricular relaxation (grade 1 diastolic dysfunction). - Aortic valve: Trileaflet; normal thickness leaflets. There was no regurgitation. - Mitral valve: Structurally normal valve. - Left atrium: The atrium was mildly dilated. - Right ventricle: Systolic function was normal. - Right atrium: The atrium was normal in size. - Tricuspid valve: There was mild regurgitation. - Pulmonic valve: There was no regurgitation. - Pulmonary arteries: Systolic pressure was within the  normal range. - Pericardium, extracardiac: There was no pericardial effusion.  Component     Latest Ref Rng 09/05/2014 10/01/2014  Cholesterol     0 - 200 mg/dL 85 098  Triglycerides     <150 mg/dL 11.9 147  HDL     >82 mg/dL 95.62 (L) 24 (L)  VLDL     0 - 40 mg/dL 13.0  25  LDL (calc)     0 - 99 mg/dL 55 55  Total CHOL/HDL Ratio      5 4.3  NonHDL      68.50   Hgb A1c MFr Bld     <5.7 % 6.8 (H) 6.8 (H)  Mean Plasma Glucose     <117 mg/dL  865 (H)    Assessment: As you may recall, he is a 59 y.o. African American male with PMH of HTN, HLD, OSA waiting for CPAP, CAD, obesity was admitted in [facility] in 11/2013 for left BG and corona radiata infarct on MRI. He was admitted again in 02/2014 for small extension of left BG and corona radiata infarct. His risk factor for stroke including HTN, HLD, CAD, obesity and OSA. His EF initially 30% but on repeat 2D 3 months later it was 55%. 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   Plan:  - continue ASA and plavix as well as lipitor for stroke and cardiac prevention - start zoloft for post stroke depression - Encourage more stimulation in daily activity, physical exercise, mental exercise.  - check BP at home and lose weight - Follow up with your primary care physician for stroke risk factor modification. Recommend maintain blood pressure goal <130/80, diabetes with hemoglobin A1c goal below 6.5% and lipids with LDL cholesterol goal below 70 mg/dL.  - follow up with PCP and cardiology regularly - treat for OSA with CPAP. - RTC in 2 months.  No orders of the defined types were placed in this encounter.    Meds ordered this encounter  Medications  . sertraline (ZOLOFT) 50 MG tablet    Sig: Take 1 tablet (50 mg total) by mouth daily.    Dispense:  90 tablet    Refill:  3    Patient Instructions  - continue ASA and plavix as well as lipitor for stroke and cardiac prevention - you have depressive mood, will prescribe zoloft for treatment - you need more stimulation in your daily activity, need more communication, speaking, physical exercise, mental exercise every day.  - check BP at home  and get on BP monitoring device - Follow up with your primary care physician for stroke risk factor modification. Recommend maintain blood pressure goal <130/80, diabetes with hemoglobin A1c goal below 6.5% and lipids with LDL cholesterol goal below 70 mg/dL.  - follow up with PCP and cardiology regularly - treat for OSA with CPAP. - lose weight - follow up in 2 months.   Marvel Plan, MD PhD [facility] [address] [place], [place] [address] [phone]

## 2014-10-25 NOTE — Patient Instructions (Addendum)
-   continue ASA and plavix as well as lipitor for stroke and cardiac prevention - you have depressive mood, will prescribe zoloft for treatment - you need more stimulation in your daily activity, need more communication, speaking, physical exercise, mental exercise every day.  - check BP at home and get on BP monitoring device - Follow up with your primary care physician for stroke risk factor modification. Recommend maintain blood pressure goal <130/80, diabetes with hemoglobin A1c goal below 6.5% and lipids with LDL cholesterol goal below 70 mg/dL.  - follow up with PCP and cardiology regularly - treat for OSA with CPAP. - lose weight - follow up in 2 months.

## 2014-10-30 ENCOUNTER — Telehealth: Payer: Self-pay | Admitting: Cardiology

## 2014-10-30 NOTE — Telephone Encounter (Signed)
Informed patient's daughter that BP is fine and that [facility] will be contacted for follow-up.  Grateful for call.

## 2014-10-30 NOTE — Telephone Encounter (Signed)
New Message  Pt daughter called Bp running 130-135/75. Wanted the nurse to know. Also request a call back to discuss CPAP machine as they haven't heard anything else about it.

## 2014-11-01 ENCOUNTER — Other Ambulatory Visit: Payer: Self-pay | Admitting: Cardiology

## 2014-11-01 NOTE — Telephone Encounter (Signed)
Spoke with Henderson Newcomer at [facility] Obtained information about the CPAP Assistance Program. Necessary paperwork printed from [email or web address].  Awaiting to speak to patient so he can supply his e-mail address to complete paperwork to be faxed to [phone].  Called patient to give him instructions for website and mandatory $100 donation for CPAP assistance program. Left message to call back.

## 2014-11-03 NOTE — Telephone Encounter (Signed)
New Patient     Patient is returning phone call please call back.  Thanks.

## 2014-11-03 NOTE — Telephone Encounter (Signed)
Left message to call back  

## 2014-11-06 ENCOUNTER — Ambulatory Visit: Payer: Self-pay | Admitting: Cardiology

## 2014-11-06 NOTE — Telephone Encounter (Signed)
Left message to call back  

## 2014-11-06 NOTE — Telephone Encounter (Signed)
Spoke with patient and his wife about the CPAP Assistance Program and enrollment. Provided them with website and explained to them that there is a mandatory $100 donation in order to participate in the program. Instructed patient to look at the website and decide whether or not it is something he is willing to do. Instructed patient to call back to let me know his decision to move forward with the program.  If he is going to, the proper paperwork will be faxed.

## 2014-11-06 NOTE — Telephone Encounter (Signed)
F/U       Pt returning call from today, please call.

## 2014-11-15 NOTE — Telephone Encounter (Signed)
Called to F/U on CPAP enrollment. Pt st they will pay the mandatory donation tomorrow and will call me afterward to let me know.

## 2014-11-17 NOTE — Telephone Encounter (Signed)
Called to F/U. Mailbox is full. Will try again later.

## 2014-11-20 NOTE — Telephone Encounter (Signed)
Scott Richardson st she will get the money tonight and make the donation.  Instructed her I will follow up with them Wednesday to get this completed.

## 2014-11-22 NOTE — Telephone Encounter (Signed)
Scott Richardson st she will make the donation tonight. Instructed her to call me when it is complete.

## 2014-12-05 ENCOUNTER — Other Ambulatory Visit: Payer: Self-pay | Admitting: Cardiology

## 2015-01-01 ENCOUNTER — Ambulatory Visit: Payer: Self-pay | Admitting: Neurology

## 2015-01-02 ENCOUNTER — Encounter: Payer: Self-pay | Admitting: Neurology

## 2015-02-21 ENCOUNTER — Other Ambulatory Visit: Payer: Self-pay | Admitting: Cardiology

## 2015-02-22 NOTE — Telephone Encounter (Signed)
Per note 10/06/14 

## 2015-03-25 ENCOUNTER — Other Ambulatory Visit: Payer: Self-pay | Admitting: Internal Medicine

## 2015-03-26 ENCOUNTER — Other Ambulatory Visit: Payer: Self-pay

## 2015-03-26 MED ORDER — AMLODIPINE BESYLATE 2.5 MG PO TABS: 2.5000 mg | ORAL_TABLET | Freq: Every day | ORAL | Status: DC

## 2015-05-04 ENCOUNTER — Other Ambulatory Visit: Payer: Self-pay | Admitting: Cardiology

## 2015-06-18 ENCOUNTER — Other Ambulatory Visit: Payer: Self-pay | Admitting: Cardiology

## 2015-07-02 ENCOUNTER — Ambulatory Visit (INDEPENDENT_AMBULATORY_CARE_PROVIDER_SITE_OTHER): Payer: Self-pay | Admitting: Cardiology

## 2015-07-02 ENCOUNTER — Encounter: Payer: Self-pay | Admitting: Cardiology

## 2015-07-02 VITALS — BP 140/78 | HR 59 | Ht 68.0 in | Wt 257.6 lb

## 2015-07-02 DIAGNOSIS — E785 Hyperlipidemia, unspecified: Secondary | ICD-10-CM

## 2015-07-02 DIAGNOSIS — I2510 Atherosclerotic heart disease of native coronary artery without angina pectoris: Secondary | ICD-10-CM | POA: Insufficient documentation

## 2015-07-02 DIAGNOSIS — I10 Essential (primary) hypertension: Secondary | ICD-10-CM

## 2015-07-02 DIAGNOSIS — G4733 Obstructive sleep apnea (adult) (pediatric): Secondary | ICD-10-CM

## 2015-07-02 DIAGNOSIS — I43 Cardiomyopathy in diseases classified elsewhere: Secondary | ICD-10-CM

## 2015-07-02 DIAGNOSIS — I119 Hypertensive heart disease without heart failure: Secondary | ICD-10-CM

## 2015-07-02 DIAGNOSIS — E669 Obesity, unspecified: Secondary | ICD-10-CM

## 2015-07-02 NOTE — Progress Notes (Signed)
Cardiology Office Note   Date:  07/02/2015   ID:  Scott Richardson, DOB 29-Nov-1955, MRN [id number]  PCP:  Scott Miyamoto, MD    Chief Complaint  Patient presents with  . Sleep Apnea  . Hypertension      History of Present Illness: Scott Richardson is a 59yo male with  mild OSA with PSG showing an AHI of 11/hr and CPAP was ordered but he was never called to get his device. He has a history of ASCAD coronary CTA with occluded RCA with distal collaterals and otherwise nonobstructive disease. He has not had any angina. He has a history of hypertensive DCM which resolved on aggressive medical therapy of his HTN.  He denies any chest pain or pressure, SOB, DOE, LE edema, palpitations or syncope.    Past Medical History  Diagnosis Date  . Chest pain   . HTN (hypertension)   . Obesity   . HLD (hyperlipidemia)   . LVH (left ventricular hypertrophy)   . Stroke (HCC)   . Noncompliance     Past Surgical History  Procedure Laterality Date  . No past surgeries    . Tee without cardioversion N/A 12/01/2013    Procedure: TRANSESOPHAGEAL ECHOCARDIOGRAM (TEE);  Surgeon: Vesta Mixer, MD;  Location: [facility] ENDOSCOPY;  Service: Cardiovascular;  Laterality: N/A;     Current Outpatient Prescriptions  Medication Sig Dispense Refill  . amLODipine (NORVASC) 2.5 MG tablet TAKE ONE TABLET BY MOUTH ONCE DAILY. NEEDS APPOINTMENT BEFORE FUTURE REFILLS. 30 tablet 0  . aspirin EC 81 MG tablet Take 1 tablet (81 mg total) by mouth daily. 90 tablet 3  . atorvastatin (LIPITOR) 80 MG tablet TAKE ONE TABLET BY MOUTH ONCE DAILY 30 tablet 6  . clopidogrel (PLAVIX) 75 MG tablet Take 1 tablet (75 mg total) by mouth daily. 90 tablet 3  . hydrochlorothiazide (HYDRODIURIL) 25 MG tablet Take 1 tablet (25 mg total) by mouth daily. 30 tablet 0  . KLOR-CON M20 20 MEQ tablet TAKE ONE TABLET BY MOUTH ONCE DAILY 30 tablet 1  . Levothyroxine Sodium 88 MCG CAPS Take 1 capsule (88 mcg total) by mouth daily  before breakfast. 30 capsule 0  . lisinopril (PRINIVIL,ZESTRIL) 40 MG tablet Take 1 tablet (40 mg total) by mouth daily. 90 tablet 3  . metoprolol tartrate (LOPRESSOR) 12.5 mg TABS tablet Take 0.5 tablets (12.5 mg total) by mouth 2 (two) times daily. 60 tablet 0  . omeprazole (PRILOSEC) 40 MG capsule Take 40 mg by mouth daily.    . sertraline (ZOLOFT) 50 MG tablet Take 1 tablet (50 mg total) by mouth daily. 90 tablet 3   No current facility-administered medications for this visit.    Allergies:   Review of patient's allergies indicates no known allergies.    Social History:  The patient  reports that he has never smoked. He has never used smokeless tobacco. He reports that he does not drink alcohol or use illicit drugs.   Family History:  The patient's family history includes Blindness in his maternal aunt, maternal aunt, maternal aunt, and maternal aunt; Hypertension in his father and mother.    ROS:  Please see the history of present illness.   Otherwise, review of systems are positive for none.   All other systems are reviewed and negative.    PHYSICAL EXAM: VS:  BP 140/78 mmHg  Pulse 59  Ht  (1.727 m)  Wt 257 lb 9.6 oz (116.847 kg)  BMI 39.18 kg/m2 , BMI Body mass index is 39.18 kg/(m^2). GEN: Well nourished, well developed, in no acute distress HEENT: normal Neck: no JVD, carotid bruits, or masses Cardiac: RRR; no murmurs, rubs, or gallops.  Trace edema  Respiratory:  clear to auscultation bilaterally, normal work of breathing GI: soft, nontender, nondistended, + BS MS: no deformity or atrophy Skin: warm and dry, no rash Neuro:  Strength and sensation are intact Psych: euthymic mood, full affect   EKG:  EKG is not ordered today.    Recent Labs: 09/05/2014: ALT 27 09/30/2014: BUN 18; Potassium 3.7; Sodium 137 10/01/2014: Creatinine, Ser 1.11; Hemoglobin 14.8; Platelets 223    Lipid Panel    Component Value Date/Time   CHOL 104 10/01/2014 0710   TRIG 125  10/01/2014 0710   HDL 24* 10/01/2014 0710   CHOLHDL 4.3 10/01/2014 0710   VLDL 25 10/01/2014 0710   LDLCALC 55 10/01/2014 0710      Wt Readings from Last 3 Encounters:  07/02/15 257 lb 9.6 oz (116.847 kg)  10/25/14 263 lb 3.2 oz (119.387 kg)  10/06/14 262 lb 6.4 oz (119.024 kg)    ASSESSMENT AND PLAN:  1. Mild OSA with CPAP at 13cm H2O - he has never received his machine yet so we will take care of getting that set up and I will see him back 10 weeks after he starts the device.   2. HTN  controlled. Continue HCTZ/ACE I/BB and amlodipine.  - check BMET 3. Obesity 4. ASCAD with occluded proximal RCA with distal collaterals and evidence of nonosbstructive disease elsewhere with no angina. Continue ASA/Plavix and statin. He knows to call if he develops chest pain or SOB. Continue ASA/Plavix/BB/ACE I/statin 5. 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   Dyslipidemia - recheck FLP and ALT.  Continue statin.    Current medicines are reviewed at length with the patient today.  The patient does not have concerns regarding medicines.  The following changes have been made:  no change  Labs/ tests ordered today: See above Assessment and Plan No orders of the defined types were placed in this encounter.     Disposition:   FU with me in 12 weeks  Signed, Quintella Reichert, MD  07/02/2015 3:10 PM    [facility] [address] [place], [place], [place]  [address] Phone: [phone]; Fax: [phone]

## 2015-07-02 NOTE — Addendum Note (Signed)
Addended by: Gunnar Fusi A on: 07/02/2015 04:27 PM   Modules accepted: Orders

## 2015-07-02 NOTE — Patient Instructions (Signed)
Medication Instructions:  Your physician recommends that you continue on your current medications as directed. Please refer to the Current Medication list given to you today.   Labwork: Your physician recommends that you return for FASTING lab work.  Testing/Procedures: None  Follow-Up: Your physician wants you to follow-up in: 12 weeks with Dr. Mayford Knife. You will receive a reminder letter in the mail two months in advance. If you don't receive a letter, please call our office to schedule the follow-up appointment.   Any Other Special Instructions Will Be Listed Below (If Applicable).

## 2015-07-03 ENCOUNTER — Telehealth: Payer: Self-pay

## 2015-07-03 ENCOUNTER — Other Ambulatory Visit (INDEPENDENT_AMBULATORY_CARE_PROVIDER_SITE_OTHER): Payer: Self-pay | Admitting: *Deleted

## 2015-07-03 DIAGNOSIS — I10 Essential (primary) hypertension: Secondary | ICD-10-CM

## 2015-07-03 DIAGNOSIS — E785 Hyperlipidemia, unspecified: Secondary | ICD-10-CM

## 2015-07-03 LAB — HEPATIC FUNCTION PANEL
ALK PHOS: 52 U/L (ref 40–115)
ALT: 23 U/L (ref 9–46)
AST: 20 U/L (ref 10–35)
Albumin: 4 g/dL (ref 3.6–5.1)
BILIRUBIN DIRECT: 0.3 mg/dL — AB (ref <=0.2)
Indirect Bilirubin: 0.9 mg/dL (ref 0.2–1.2)
Total Bilirubin: 1.2 mg/dL (ref 0.2–1.2)
Total Protein: 7.4 g/dL (ref 6.1–8.1)

## 2015-07-03 LAB — BASIC METABOLIC PANEL
BUN: 14 mg/dL (ref 7–25)
CALCIUM: 9.3 mg/dL (ref 8.6–10.3)
CO2: 25 mmol/L (ref 20–31)
CREATININE: 1.05 mg/dL (ref 0.70–1.33)
Chloride: 104 mmol/L (ref 98–110)
GLUCOSE: 113 mg/dL — AB (ref 65–99)
Potassium: 4 mmol/L (ref 3.5–5.3)
Sodium: 137 mmol/L (ref 135–146)

## 2015-07-03 LAB — LIPID PANEL
CHOL/HDL RATIO: 3.8 ratio (ref <=5.0)
Cholesterol: 123 mg/dL — ABNORMAL LOW (ref 125–200)
HDL: 32 mg/dL — ABNORMAL LOW (ref >=40)
LDL Cholesterol: 78 mg/dL (ref <130)
Triglycerides: 67 mg/dL (ref <150)
VLDL: 13 mg/dL (ref <30)

## 2015-07-03 NOTE — Telephone Encounter (Signed)
Patient in yesterday for OV. Patient's wife st they never received their PAP.  Patient's wife st they never paid the donation for the program because she did not know how to use the webpage to pay. Informed the patient and his wife that our CPAP assistant would call to walk them through the payment procedure as they cannot get a device without the donation.  To Artis Flock, CMA.

## 2015-07-05 ENCOUNTER — Telehealth: Payer: Self-pay | Admitting: *Deleted

## 2015-07-05 DIAGNOSIS — E785 Hyperlipidemia, unspecified: Secondary | ICD-10-CM

## 2015-07-05 MED ORDER — EZETIMIBE 10 MG PO TABS: 10.0000 mg | ORAL_TABLET | Freq: Every day | ORAL | Status: DC

## 2015-07-05 NOTE — Telephone Encounter (Signed)
Spoke with pt's wife, DPR on file. Informed her of lab results and new orders. Verified pharmacy and sent in prescription. Scheduled lab appt for 12/8. Wife verbalized understanding and was in agreement with this plan.

## 2015-07-05 NOTE — Telephone Encounter (Signed)
-----   Message from Quintella Reichert, MD sent at 07/03/2015  3:37 PM EDT ----- LDL not at goal - add Zetia 10mg  daily and recheck FLP and ALT in 8 weeks

## 2015-07-06 NOTE — Telephone Encounter (Signed)
Gave patients wife step by step instructions on paying the assistance fee for CPAP Machine.   Told her that once it gave her a confirmation number that payment was received to call and give that number to me and I would make sure it matches the paperwork.

## 2015-07-09 ENCOUNTER — Telehealth: Payer: Self-pay | Admitting: Cardiology

## 2015-07-09 NOTE — Telephone Encounter (Signed)
Spoke with pt's wife, DPR on file. Informed her that I would route this message to Dr. Mayford Knife for review and advisement on alternatives. Advised wife that Weldon Picking is going generic in December so we could possibly revisit this medication at that time. Wife verbalized understanding and states that she is also going to call a couple of other pharmacies in the meantime to see about pricing.

## 2015-07-09 NOTE — Telephone Encounter (Signed)
New problem    Pt has prescription for Zetia which will cost $1,057, need to speak to nurse concerning alternative.

## 2015-07-09 NOTE — Telephone Encounter (Signed)
Lets see if we can start him on it when it becomes generic

## 2015-07-09 NOTE — Telephone Encounter (Signed)
**Note De-Identified Sakari Raisanen Obfuscation** Octavio Graves, the pts wife, is advised and she verbalized understanding and states that she will notify the pt.

## 2015-08-03 ENCOUNTER — Other Ambulatory Visit: Payer: Self-pay | Admitting: Cardiology

## 2015-08-29 ENCOUNTER — Encounter: Payer: Self-pay | Admitting: Neurology

## 2015-08-29 ENCOUNTER — Ambulatory Visit (INDEPENDENT_AMBULATORY_CARE_PROVIDER_SITE_OTHER): Payer: Self-pay | Admitting: Neurology

## 2015-08-29 VITALS — BP 148/79 | HR 67 | Ht 68.0 in | Wt 262.0 lb

## 2015-08-29 DIAGNOSIS — F329 Major depressive disorder, single episode, unspecified: Secondary | ICD-10-CM

## 2015-08-29 DIAGNOSIS — E669 Obesity, unspecified: Secondary | ICD-10-CM

## 2015-08-29 DIAGNOSIS — G4733 Obstructive sleep apnea (adult) (pediatric): Secondary | ICD-10-CM

## 2015-08-29 DIAGNOSIS — E785 Hyperlipidemia, unspecified: Secondary | ICD-10-CM

## 2015-08-29 DIAGNOSIS — F32A Depression, unspecified: Secondary | ICD-10-CM

## 2015-08-29 DIAGNOSIS — I63312 Cerebral infarction due to thrombosis of left middle cerebral artery: Secondary | ICD-10-CM

## 2015-08-29 DIAGNOSIS — I10 Essential (primary) hypertension: Secondary | ICD-10-CM

## 2015-08-29 MED ORDER — CLOPIDOGREL BISULFATE 75 MG PO TABS: 75.0000 mg | ORAL_TABLET | Freq: Every day | ORAL | Status: DC

## 2015-08-29 NOTE — Progress Notes (Signed)
STROKE NEUROLOGY FOLLOW UP NOTE  NAME: Scott Richardson DOB: 1956/05/07  REASON FOR VISIT: stroke follow up HISTORY FROM: chart and wife  Today we had the pleasure of seeing Scott Richardson in follow-up at our Neurology Clinic. Pt was accompanied by wife.   History Summary Scott Richardson is an 59 y.o. male with PMH of hypertension, hyperlipidemia, OSA not on CPAP, CAD, obesity was admitted in [facility] in 11/2013 for left BG and corona radiata infarct on MRI. At that time, his blood pressure was not in good controlled with BP 222/91. MRA of the brain showed moderate intracranial stenosis. Carotid dopplers were performed showing no ICA stenosis. TEE showed no thrombus, no ASD or PFO but EF approximately 30%. LDL 130, A1c 6.2.He was discharged with Plavix and statin.   He was again presented to ED in 02/2014 due to transient difficulty with speech and right facial droop. Had MRI repeat showed possible small acute infarct surrounding the previous stroke in March. Repeat 2-D echo at that time showed ejection fraction 55-60%. His symptoms resolved and he was discharged from ER.  04/18/14 follow up (LL) - during the interval time, patient was doing fine without recurrent stroke-like symptoms. However, since stroke, wife feels like he is cognitively slower and more confused. 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 Since the stroke he has not been driving and was recommended by his PCP to file for disability which he has not done yet. Since discharge he attended outpatient speech therapy. He and his wife states that he has been consistently taking all of his medications since discharge. Blood pressure in the office today is 132/74. He has been scheduled for a sleep study to evaluate for obstructive sleep apnea. Wife states that since TEE his voice has been soft and hoarse, making it difficult to  understand him.  10/25/14 follow up - the patient has been doing the same. Patient denies any recurrent stroke-like symptoms. He was recently admitted to [facility] early this month for right shoulder pain, had cardiology consult, ruled out cardiac etiology. He does have CAD with RCA occlusion and he was put on dural antiplatelet. He followed with Dr. Mayford Knife two weeks ago and was stable from cardiology standpoint. However, wife still complains that he has memory issues and not remember well after stroke. By my talking to them, it seems that he has depressive mood, lack of motivation, sedentary life style which is much different from prior to stroke. He denies any SI or HI.   Interval History During the interval time, pt has been doing better. After taking zoloft since last visit, his depression is much better and now doing better. He saw Dr. Mayford Knife at meantime, and instructed to get CPAP device for OSA treatment, however, the he has not called to set up CPAP device yet. His blood pressure at home every running at 145/75, today in clinic 148/79. He is on HCTZ 25, lisinopril 40, metoprolol 12.5 twice a day and questionable amlodipine 2.5 g daily (wife has to check at home to make sure). Needs refill for Plavix today. Otherwise, no complaints.  REVIEW OF SYSTEMS: Full 14 system review of systems performed and notable only for those listed below and in HPI above, all others are negative:  Constitutional: N/A  Cardiovascular: leg swelling  Ear/Nose/Throat: N/A  Skin: N/A  Eyes: N/A  Respiratory: N/A  Gastroitestinal: N/A  Genitourinary:  N/A Hematology/Lymphatic: N/A  Endocrine: N/A  Musculoskeletal:   Allergy/Immunology: N/A  Neurological:   Psychiatric: N/A Sleep: snoring  The following represents the patient's updated allergies and side effects list: No Known Allergies  The neurologically relevant items on the patient's problem list were reviewed on today's visit.  Neurologic Examination  A  problem focused neurological exam (12 or more points of the single system neurologic examination, vital signs counts as 1 point, cranial nerves count for 8 points) was performed.  Blood pressure 148/79, pulse 67, height 5\' 8"  (1.727 m), weight 262 lb (118.842 kg).  General - Well nourished, well developed, in no apparent distress.  Ophthalmologic - Sharp disc margins OU.  Cardiovascular - Regular rate and rhythm with no murmur.  Mental Status -  Level of arousal and orientation to time, place, and person were intact. Language including expression, naming, repetition, comprehension, reading, and writing was assessed and found intact. Attention span and concentration were normal. Recent and remote memory were intact with delayed recall 3/3. Fund of Knowledge was assessed and was impaired and does not know previous presidents.  Cranial Nerves II - XII - II - Visual field intact OU. III, IV, VI - Extraocular movements intact. V - Facial sensation intact bilaterally. VII - Facial movement intact bilaterally. VIII - Hearing & vestibular intact bilaterally. X - Palate elevates symmetrically. XI - Chin turning & shoulder shrug intact bilaterally. XII - Tongue protrusion intact.  Motor Strength - The patient's strength was normal in all extremities and pronator drift was absent.  Bulk was normal and fasciculations were absent.   Motor Tone - Muscle tone was assessed at the neck and appendages and was normal.  Reflexes - The patient's reflexes were normal in all extremities and he had no pathological reflexes.  Sensory - Light touch, temperature/pinprick, vibration and proprioception, and Romberg testing were assessed and were normal.    Coordination - The patient had normal movements in the hands and feet with no ataxia or dysmetria.  Tremor was absent.  Gait and Station - The patient's transfers, posture, gait, station, and turns were observed as normal.  Data reviewed: I personally  reviewed the images and agree with the radiology interpretations.  MRI and MRA 11/2013 1. Acute nonhemorrhagic infarct involving the left lentiform nucleus extending to the left caudate. 2. 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 3. 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 4. Mild narrowing of the left superior division left M2 segment. 5. High-grade stenosis of the proximal left A1 segment. This corresponds with the area of infarction.  MRI 02/2014 1. Extension of left basal ganglia infarct with areas of restricted diffusion along the lateral margin of the previous infarct territory. 2. Evidence for wallerian degeneration.  CUS 11/2013 - Bilateral: 1-39% ICA stenosis. Vertebral artery flow is antegrade.  TEE 11/2013 - - Left ventricle: Systolic function was moderately to severely reduced. The estimated ejection fraction was in the range of 30% to 35%. - Aortic valve: No evidence of vegetation. - Mitral valve: Mild to moderate regurgitation. - Left atrium: No evidence of thrombus in the atrial cavity or appendage. - Atrial septum: No defect or patent foramen ovale was identified. - Tricuspid valve: No evidence of vegetation. - Pulmonic valve: No evidence of vegetation.  TTE 02/2014 - Left ventricle: The cavity size was normal. There was moderate concentric hypertrophy. Systolic function was normal. The estimated ejection  fraction was in the range of 55% to 60%. Wall motion was normal; there were no regional wall motion abnormalities. Doppler parameters are consistent with abnormal left ventricular relaxation (grade 1 diastolic dysfunction). - Aortic valve: Trileaflet; normal thickness leaflets. There was no regurgitation. - Mitral valve: Structurally normal valve. - Left atrium: The atrium was mildly dilated. -  Right ventricle: Systolic function was normal. - Right atrium: The atrium was normal in size. - Tricuspid valve: There was mild regurgitation. - Pulmonic valve: There was no regurgitation. - Pulmonary arteries: Systolic pressure was within the normal range. - Pericardium, extracardiac: There was no pericardial effusion.  Component     Latest Ref Rng 09/05/2014 10/01/2014  Cholesterol     0 - 200 mg/dL 85 578  Triglycerides     <150 mg/dL 46.9 629  HDL     >52 mg/dL 84.13 (L) 24 (L)  VLDL     0 - 40 mg/dL 24.4 25  LDL (calc)     0 - 99 mg/dL 55 55  Total CHOL/HDL Ratio      5 4.3  NonHDL      68.50   Hgb A1c MFr Bld     <5.7 % 6.8 (H) 6.8 (H)  Mean Plasma Glucose     <117 mg/dL  010 (H)    Assessment: As you may recall, he is a 59 y.o. African American male with PMH of HTN, HLD, OSA waiting for CPAP, CAD, obesity was admitted in [facility] in 11/2013 for left BG and corona radiata infarct on MRI. He was admitted again in 02/2014 for small extension of left BG and corona radiata infarct. His risk factor for stroke including HTN, HLD, CAD, obesity and OSA. His EF initially 30% but on repeat 2D 3 months later it was 55%. 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 During the interval time, depression much better. Blood pressure still not in good control, and CPAP not set up yet for OSA.  Plan:  - continue ASA and plavix as well as lipitor for stroke and cardiac prevention - continue zoloft for post stroke depression - check BP at home and may consider to increase amlodipine to  daily - Follow up with your primary care physician for stroke risk factor modification. Recommend maintain blood pressure goal <130/80, diabetes with hemoglobin A1c goal below 6.5% and lipids with LDL cholesterol goal below 70 mg/dL.  - follow up with cardiology as scheduled - Encouraged to set up CPAP for OSA treatment. - follow up in 6 months.  I spent  more than 25 minutes of face to face time with the patient. Greater than 50% of time was spent in counseling and coordination of care. We have discussed about OSA treatment, compliant with medication, and check blood pressure at home and record.   No orders of the defined types were placed in this encounter.    Meds ordered this encounter  Medications  . clopidogrel (PLAVIX) 75 MG tablet    Sig: Take 1 tablet (75 mg total) by mouth daily.    Dispense:  90 tablet    Refill:  3    Patient Instructions  - continue ASA and plavix as well as lipitor for stroke and cardiac prevention - continue zoloft for post stroke depression - check BP at home and may consider to increase amlodipine to  daily - Follow up with your primary care physician for stroke risk factor modification.  Recommend maintain blood pressure goal <130/80, diabetes with hemoglobin A1c goal below 6.5% and lipids with LDL cholesterol goal below 70 mg/dL.  - follow up with cardiology  - call to set up CPAP for OSA treatment. - follow up in 6 months.    Marvel Plan, MD PhD [facility] [address] [place], [place] [address] [phone]

## 2015-08-29 NOTE — Patient Instructions (Signed)
-   continue ASA and plavix as well as lipitor for stroke and cardiac prevention - continue zoloft for post stroke depression - check BP at home and may consider to increase amlodipine to 5mg  daily - Follow up with your primary care physician for stroke risk factor modification. Recommend maintain blood pressure goal <130/80, diabetes with hemoglobin A1c goal below 6.5% and lipids with LDL cholesterol goal below 70 mg/dL.  - follow up with cardiology  - call to set up CPAP for OSA treatment. - follow up in 6 months.

## 2015-09-06 ENCOUNTER — Other Ambulatory Visit (INDEPENDENT_AMBULATORY_CARE_PROVIDER_SITE_OTHER): Payer: Self-pay

## 2015-09-06 DIAGNOSIS — E785 Hyperlipidemia, unspecified: Secondary | ICD-10-CM

## 2015-09-06 LAB — LIPID PANEL
CHOL/HDL RATIO: 3.9 ratio (ref <=5.0)
Cholesterol: 128 mg/dL (ref 125–200)
HDL: 33 mg/dL — AB (ref >=40)
LDL Cholesterol: 81 mg/dL (ref <130)
Triglycerides: 69 mg/dL (ref <150)
VLDL: 14 mg/dL (ref <30)

## 2015-09-06 LAB — ALT: ALT: 20 U/L (ref 9–46)

## 2015-09-17 ENCOUNTER — Telehealth: Payer: Self-pay

## 2015-09-17 DIAGNOSIS — E785 Hyperlipidemia, unspecified: Secondary | ICD-10-CM

## 2015-09-17 MED ORDER — EZETIMIBE 10 MG PO TABS: 10.0000 mg | ORAL_TABLET | Freq: Every day | ORAL | Status: DC

## 2015-09-17 NOTE — Telephone Encounter (Signed)
Zetia 10 mg daily ordered. FLP and ALT scheduled for February 20. Patient agrees with treatment plan.

## 2015-09-17 NOTE — Telephone Encounter (Signed)
-----   Message from Quintella Reichert, MD sent at 09/06/2015 10:28 PM EST ----- LDL not at goal - add zetia 10mg  daily and recheck FLp and ALT in 8 weeks

## 2015-11-19 ENCOUNTER — Other Ambulatory Visit (INDEPENDENT_AMBULATORY_CARE_PROVIDER_SITE_OTHER): Payer: Self-pay | Admitting: *Deleted

## 2015-11-19 DIAGNOSIS — E785 Hyperlipidemia, unspecified: Secondary | ICD-10-CM

## 2015-11-19 LAB — ALT: ALT: 22 U/L (ref 9–46)

## 2015-11-19 LAB — LIPID PANEL
CHOL/HDL RATIO: 3.5 ratio (ref <=5.0)
CHOLESTEROL: 99 mg/dL — AB (ref 125–200)
HDL: 28 mg/dL — ABNORMAL LOW (ref >=40)
LDL Cholesterol: 56 mg/dL (ref <130)
Triglycerides: 74 mg/dL (ref <150)
VLDL: 15 mg/dL (ref <30)

## 2016-02-26 ENCOUNTER — Ambulatory Visit: Payer: Self-pay | Admitting: Neurology

## 2016-02-27 ENCOUNTER — Encounter: Payer: Self-pay | Admitting: Neurology

## 2016-09-11 ENCOUNTER — Other Ambulatory Visit: Payer: Self-pay | Admitting: Cardiology

## 2016-09-25 ENCOUNTER — Telehealth: Payer: Self-pay | Admitting: Cardiology

## 2016-09-25 DIAGNOSIS — Z9119 Patient's noncompliance with other medical treatment and regimen: Secondary | ICD-10-CM

## 2016-09-25 DIAGNOSIS — Z91199 Patient's noncompliance with other medical treatment and regimen due to unspecified reason: Secondary | ICD-10-CM

## 2016-09-25 DIAGNOSIS — I10 Essential (primary) hypertension: Secondary | ICD-10-CM

## 2016-09-25 DIAGNOSIS — I639 Cerebral infarction, unspecified: Secondary | ICD-10-CM

## 2016-09-25 MED ORDER — EZETIMIBE 10 MG PO TABS: 10.0000 mg | ORAL_TABLET | Freq: Every day | ORAL | 0 refills | Status: DC

## 2016-09-25 MED ORDER — AMLODIPINE BESYLATE 2.5 MG PO TABS: 2.5000 mg | ORAL_TABLET | Freq: Every day | ORAL | 0 refills | Status: DC

## 2016-09-25 MED ORDER — POTASSIUM CHLORIDE CRYS ER 20 MEQ PO TBCR: 20.0000 meq | EXTENDED_RELEASE_TABLET | Freq: Every day | ORAL | 0 refills | Status: DC

## 2016-09-25 MED ORDER — LISINOPRIL 40 MG PO TABS: 40.0000 mg | ORAL_TABLET | Freq: Every day | ORAL | 0 refills | Status: DC

## 2016-09-25 MED ORDER — ATORVASTATIN CALCIUM 80 MG PO TABS: 80.0000 mg | ORAL_TABLET | Freq: Every day | ORAL | 0 refills | Status: DC

## 2016-09-25 NOTE — Telephone Encounter (Signed)
New Message   *STAT* If patient is at the pharmacy, call can be transferred to refill team.   1. Which medications need to be refilled? (please list name of each medication and dose if known)  amlodipine (Norvasc) 2.5 mg tablet once daily aspirin EC 81 mg tablet once daily atorvastatin (Lipitor) 80 mg tablet once daily ezetimibe (Zetia) 10 mg tablet once daily Klor-Con M20 20 MEQ tablet once daily Levothyrozine Sodium 88 MCG Caps once daily Lisiniopril (Prinivil, Zestril) 40 mg tablet once daily  2. Which pharmacy/location (including street and city if local pharmacy) is medication to be sent to? [facility], [address], [place], [place]  3. Do they need a 30 day or 90 day supply?  30 day supply

## 2016-10-07 IMAGING — CT CT HEART MORP W/ CTA COR W/ SCORE W/ CA W/CM &/OR W/O CM
1 of 10 series · 1 of 20 positions shown, 2 images · non-contrast
Comparison: No priors.

CLINICAL DATA: Chest pain

EXAM:
Cardiac CTA
MEDICATIONS:
Sub lingual nitro. 4mg and lopressor 5mg
TECHNIQUE: The patient was scanned on a [employer] [REDACTED]ice scanner. Gantry
rotation speed was 270 msecs. Collimation was .9mm. A 100 kV
prospective scan was triggered in the descending thoracic aorta at
111 HU's with 5% padding centered around 78% of the R-R interval.
Average HR during the scan was 72 bpm. The 3D data set was
interpreted on a dedicated work station using MPR, MIP and VRT
modes. A total of 80cc of contrast was used.

[Series 300: locator · axial · 0.35mm/px · z∈[+1018,+1018]mm · 1 of 1 slices shown, 2 images]
[im 1/1  vessel]
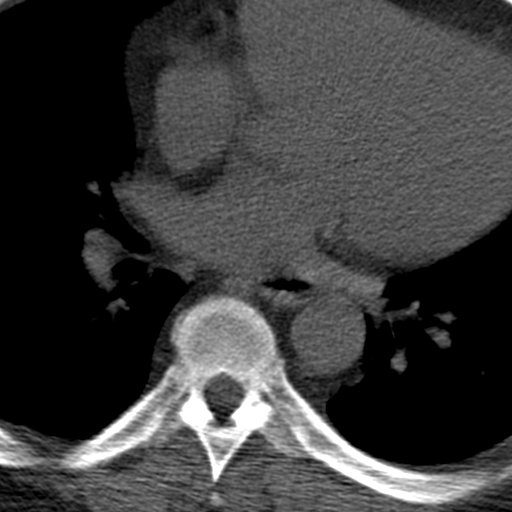
[im 1/1  lung]
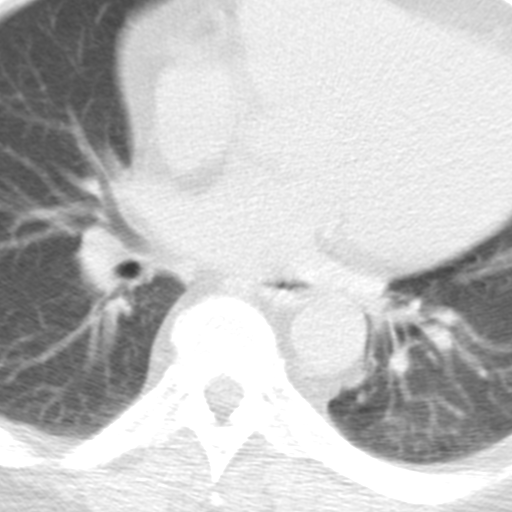

[1 of 20 positions shown; findings below may reference images not displayed]

FINDINGS: Non-cardiac: See separate report from [REDACTED]. No
significant findings on limited lung and soft tissue windows.

Calcium Score:

Coronary Arteries: Right dominant with no anomalies

LM:  50% or less mixed calcified plaque

LAD:  50% or less mixed plaque in the proximal and mid vessel

D1: Small normal

D2: Small normal

D3:  Small and normal

Circumflex: Motion artifact and tortuosity make mid vessel hard to
interpret. 50% mixed plaque in the mid vessel at take off of OM1

OM1: Less than 50% soft plaque in proximal vessel

Distal AV groove branch appears large vs collateral with no
significant disease

RCA: Appears occluded proximally. Minimal dye seen in mid vessel
PDA/PLA seen much better suggesting collaterals to distal RCA

PDA:  Likely fill from collaterals

PLA:  Likely fill from collaterals
IMPRESSION: 1) Calcium Score 130 71st percentile for age and sex matched
controls

2) Definite CAD. Occluded proximal RCA with likely distal
collaterals Non obstructive mixed plaque in the LM, proximal and mid
LAD and mid circumflex

3) No significant non coronary findings

Overall images somewhat suboptimal due to patent motion and size

Mongae Fex

EXAM:
OVER-READ INTERPRETATION  CT CHEST

The following report is an over-read performed by radiologist Dr.
over-read does not include interpretation of cardiac or coronary
anatomy or pathology. The coronary calcium score/coronary CTA
interpretation by the cardiologist is attached.
FINDINGS: Small amount of subsegmental atelectasis or scarring in the inferior
segment of the lingula. Within the visualized portions of the thorax
there is no acute consolidative airspace disease, no pneumothorax,
no suspicious appearing pulmonary nodule or mass, and no
lymphadenopathy. Visualized portions of the upper abdomen are
unremarkable. There are no aggressive appearing lytic or blastic
lesions noted in the visualized portions of the skeleton.
IMPRESSION: 1. No significant incidental noncardiac findings noted.

## 2016-10-13 ENCOUNTER — Other Ambulatory Visit: Payer: Self-pay | Admitting: Cardiology

## 2016-10-30 ENCOUNTER — Ambulatory Visit (INDEPENDENT_AMBULATORY_CARE_PROVIDER_SITE_OTHER): Payer: Self-pay | Admitting: Cardiology

## 2016-10-30 ENCOUNTER — Encounter: Payer: Self-pay | Admitting: Cardiology

## 2016-10-30 VITALS — BP 156/88 | HR 54 | Ht 68.0 in | Wt 256.8 lb

## 2016-10-30 DIAGNOSIS — E7800 Pure hypercholesterolemia, unspecified: Secondary | ICD-10-CM

## 2016-10-30 DIAGNOSIS — I2510 Atherosclerotic heart disease of native coronary artery without angina pectoris: Secondary | ICD-10-CM

## 2016-10-30 DIAGNOSIS — I493 Ventricular premature depolarization: Secondary | ICD-10-CM

## 2016-10-30 DIAGNOSIS — G4733 Obstructive sleep apnea (adult) (pediatric): Secondary | ICD-10-CM

## 2016-10-30 DIAGNOSIS — I43 Cardiomyopathy in diseases classified elsewhere: Secondary | ICD-10-CM

## 2016-10-30 DIAGNOSIS — I10 Essential (primary) hypertension: Secondary | ICD-10-CM

## 2016-10-30 DIAGNOSIS — I119 Hypertensive heart disease without heart failure: Secondary | ICD-10-CM

## 2016-10-30 LAB — HEPATIC FUNCTION PANEL
ALBUMIN: 3.8 g/dL (ref 3.6–4.8)
ALT: 19 IU/L (ref 0–44)
AST: 18 IU/L (ref 0–40)
Alkaline Phosphatase: 59 IU/L (ref 39–117)
BILIRUBIN TOTAL: 1.1 mg/dL (ref 0.0–1.2)
BILIRUBIN, DIRECT: 0.23 mg/dL (ref 0.00–0.40)
TOTAL PROTEIN: 6.7 g/dL (ref 6.0–8.5)

## 2016-10-30 LAB — BASIC METABOLIC PANEL
BUN/Creatinine Ratio: 10 (ref 10–24)
BUN: 10 mg/dL (ref 8–27)
CALCIUM: 9.1 mg/dL (ref 8.6–10.2)
CHLORIDE: 99 mmol/L (ref 96–106)
CO2: 22 mmol/L (ref 18–29)
Creatinine, Ser: 0.98 mg/dL (ref 0.76–1.27)
GFR calc Af Amer: 96 mL/min/{1.73_m2} (ref >59)
GFR calc non Af Amer: 83 mL/min/{1.73_m2} (ref >59)
GLUCOSE: 94 mg/dL (ref 65–99)
Potassium: 4.2 mmol/L (ref 3.5–5.2)
Sodium: 137 mmol/L (ref 134–144)

## 2016-10-30 LAB — LIPID PANEL
CHOL/HDL RATIO: 3.6 ratio (ref 0.0–5.0)
Cholesterol, Total: 118 mg/dL (ref 100–199)
HDL: 33 mg/dL — AB (ref >39)
LDL CALC: 72 mg/dL (ref 0–99)
Triglycerides: 64 mg/dL (ref 0–149)
VLDL CHOLESTEROL CAL: 13 mg/dL (ref 5–40)

## 2016-10-30 MED ORDER — AMLODIPINE BESYLATE 5 MG PO TABS: 5.0000 mg | ORAL_TABLET | Freq: Every day | ORAL | 11 refills | Status: DC

## 2016-10-30 NOTE — Patient Instructions (Signed)
Medication Instructions:  1) INCREASE AMLODIPINE to 5 mg daily  Labwork: TODAY: BMET, LFTs, Lipids  Testing/Procedures: None  Follow-Up: Your physician wants you to follow-up in: 1 year with Dr. Mayford Knife. You will receive a reminder letter in the mail two months in advance. If you don't receive a letter, please call our office to schedule the follow-up appointment.   Any Other Special Instructions Will Be Listed Below (If Applicable).     If you need a refill on your cardiac medications before your next appointment, please call your pharmacy.

## 2016-10-30 NOTE — Progress Notes (Signed)
Cardiology Office Note    Date:  10/30/2016   ID:  Scott Richardson, DOB Jan 05, 1956, MRN [id number]  PCP:  Abigail Miyamoto, MD  Cardiologist:  Armanda Magic, MD   Chief Complaint  Patient presents with  . Coronary Artery Disease  . Hypertension  . Hyperlipidemia  . Sleep Apnea    History of Present Illness:  Scott Richardson is a 61 y.o. male with mild OSA with PSG showing an AHI of 11/hr on CPAP but he never got the CPAP device.  He has a history of ASCAD coronary CTA with occluded RCA with distal collaterals and otherwise nonobstructive disease.  He has a history of hypertensive DCM which resolved on aggressive medical therapy of his HTN.  He denies any chest pain  or pressure, SOB, DOE, PND, orthopnea, LE edema, palpitations, dizziness or syncope.     Past Medical History:  Diagnosis Date  . CAD (coronary artery disease), native coronary artery    coronary CTA with occluded RCA with distal collaterals and otherwise nonobstructive disease.  Marland Kitchen HLD (hyperlipidemia)   . HTN (hypertension)   . LVH (left ventricular hypertrophy)   . Noncompliance   . Obesity   . Stroke [facility])     Past Surgical History:  Procedure Laterality Date  . NO PAST SURGERIES    . TEE WITHOUT CARDIOVERSION N/A 12/01/2013   Procedure: TRANSESOPHAGEAL ECHOCARDIOGRAM (TEE);  Surgeon: Vesta Mixer, MD;  Location: [facility] ENDOSCOPY;  Service: Cardiovascular;  Laterality: N/A;    Current Medications: Outpatient Medications Prior to Visit  Medication Sig Dispense Refill  . amLODipine (NORVASC) 2.5 MG tablet Take 1 tablet (2.5 mg total) by mouth daily. 60 tablet 0  . aspirin EC 81 MG tablet Take 1 tablet (81 mg total) by mouth daily. 90 tablet 3  . atorvastatin (LIPITOR) 80 MG tablet Take 1 tablet (80 mg total) by mouth daily. 60 tablet 0  . clopidogrel (PLAVIX) 75 MG tablet Take 1 tablet (75 mg total) by mouth daily. 90 tablet 3  . ezetimibe (ZETIA) 10 MG tablet Take 1 tablet (10 mg total) by mouth daily. 60  tablet 0  . hydrochlorothiazide (HYDRODIURIL) 25 MG tablet Take 1 tablet (25 mg total) by mouth daily. 30 tablet 0  . Levothyroxine Sodium 88 MCG CAPS Take 1 capsule (88 mcg total) by mouth daily before breakfast. 30 capsule 0  . lisinopril (PRINIVIL,ZESTRIL) 40 MG tablet Take 1 tablet (40 mg total) by mouth daily. 60 tablet 0  . metoprolol tartrate (LOPRESSOR) 12.5 mg TABS tablet Take 0.5 tablets (12.5 mg total) by mouth 2 (two) times daily. 60 tablet 0  . potassium chloride SA (KLOR-CON M20) 20 MEQ tablet Take 1 tablet (20 mEq total) by mouth daily. 60 tablet 0  . sertraline (ZOLOFT) 50 MG tablet Take 1 tablet (50 mg total) by mouth daily. 90 tablet 3   No facility-administered medications prior to visit.      Allergies:   Patient has no known allergies.   Social History   Social History  . Marital status: Married    Spouse name: Maguire Sime  . Number of children: 2  . Years of education: HS   Occupational History  .  Other    n/a   Social History Main Topics  . Smoking status: Never Smoker  . Smokeless tobacco: Never Used  . Alcohol use No  . Drug use: No  . Sexual activity: Not Currently   Other Topics Concern  . None   Social History  Narrative   Patient is married with 2 children.   Patient is right handed.   Patient has hs education.   Patient drinks 1-2 cups daily.     Family History:  The patient's family history includes Blindness in his maternal aunt, maternal aunt, maternal aunt, and maternal aunt; Hypertension in his father and mother.   ROS:   Please see the history of present illness.    ROS All other systems reviewed and are negative.  No flowsheet data found.     PHYSICAL EXAM:   VS:  BP (!) 156/88   Pulse (!) 54   Ht 5\' 8"  (1.727 m)   Wt 256 lb 12.8 oz (116.5 kg)   BMI 39.05 kg/m    GEN: Well nourished, well developed, in no acute distress  HEENT: normal  Neck: no JVD, carotid bruits, or masses Cardiac: RRR; no murmurs, rubs, or  gallops,no edema.  Intact distal pulses bilaterally.  Respiratory:  clear to auscultation bilaterally, normal work of breathing GI: soft, nontender, nondistended, + BS MS: no deformity or atrophy  Skin: warm and dry, no rash Neuro:  Alert and Oriented x 3, Strength and sensation are intact Psych: euthymic mood, full affect  Wt Readings from Last 3 Encounters:  10/30/16 256 lb 12.8 oz (116.5 kg)  08/29/15 262 lb (118.8 kg)  07/02/15 257 lb 9.6 oz (116.8 kg)      Studies/Labs Reviewed:   EKG:  EKG is ordered today.  The ekg ordered today demonstrates sinus bradycardia at 53bpm with RBBB with lateral T wave abnormality  Recent Labs: 11/19/2015: ALT 22   Lipid Panel    Component Value Date/Time   CHOL 99 (L) 11/19/2015 0850   TRIG 74 11/19/2015 0850   HDL 28 (L) 11/19/2015 0850   CHOLHDL 3.5 11/19/2015 0850   VLDL 15 11/19/2015 0850   LDLCALC 56 11/19/2015 0850    Additional studies/ records that were reviewed today include:  none    ASSESSMENT:    1. Coronary artery disease involving native coronary artery of native heart without angina pectoris   2. Essential hypertension   3. 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 PVC's (premature ventricular contractions)   5. OSA (obstructive sleep apnea)   6. Pure hypercholesterolemia      PLAN:  In order of problems listed above:  1.  ASCAD  - coronary CTA showed occluded RCA with distal collaterals and otherwise nonobstructive disease. He has not had any anginal symptoms.  He will continue on ASA/Plavix/statin and BB. 2.  HTN - Bp borderline elevated on current meds.  I will increase his amlodipine to 5mg  daily and have him check his BP daily for a week and call with the results. Continue BB/ACE I and diuretic.  Check BMET. 3.  Hypertensive DCM - EF has normalized. 4.  PVC's - asymptomatic.  Continue low dose BB. 5.  OSA - the patient has mild OSA but never got his CPAP device.  I will check  into why this did not occur. 6.  Hyperlipidemia with LDL goal < 70.  Continue statin and zetia.  Check FLP and ALT.       Medication Adjustments/Labs and Tests Ordered: Current medicines are reviewed at length with the patient today.  Concerns regarding medicines are outlined above.  Medication changes, Labs and Tests ordered today are listed in the Patient Instructions below.  There are no Patient Instructions on file for this visit.   Signed, Gloris Manchester  Mayford Knife, MD  10/30/2016 10:32 AM    [facility] [address] [place], [place], [place]  [address] Phone: [phone]; Fax: [phone]

## 2016-10-31 ENCOUNTER — Telehealth: Payer: Self-pay | Admitting: Cardiology

## 2016-10-31 NOTE — Telephone Encounter (Signed)
Patient's wife made aware of patient's lab results.

## 2016-10-31 NOTE — Telephone Encounter (Signed)
New Message     Please call returning triages call

## 2017-06-18 ENCOUNTER — Other Ambulatory Visit: Payer: Self-pay | Admitting: Cardiology

## 2017-06-18 DIAGNOSIS — Z91199 Patient's noncompliance with other medical treatment and regimen due to unspecified reason: Secondary | ICD-10-CM

## 2017-06-18 DIAGNOSIS — I639 Cerebral infarction, unspecified: Secondary | ICD-10-CM

## 2017-06-18 DIAGNOSIS — I10 Essential (primary) hypertension: Secondary | ICD-10-CM

## 2017-06-18 DIAGNOSIS — Z9119 Patient's noncompliance with other medical treatment and regimen: Secondary | ICD-10-CM

## 2018-05-02 ENCOUNTER — Other Ambulatory Visit: Payer: Self-pay | Admitting: Cardiology

## 2018-05-03 ENCOUNTER — Other Ambulatory Visit: Payer: Self-pay | Admitting: Cardiology

## 2018-05-03 DIAGNOSIS — I10 Essential (primary) hypertension: Secondary | ICD-10-CM

## 2018-05-03 DIAGNOSIS — Z91199 Patient's noncompliance with other medical treatment and regimen due to unspecified reason: Secondary | ICD-10-CM

## 2018-05-03 DIAGNOSIS — I639 Cerebral infarction, unspecified: Secondary | ICD-10-CM

## 2018-05-03 DIAGNOSIS — Z9119 Patient's noncompliance with other medical treatment and regimen: Secondary | ICD-10-CM

## 2018-05-03 MED ORDER — EZETIMIBE 10 MG PO TABS: 10.0000 mg | ORAL_TABLET | Freq: Every day | ORAL | 0 refills | Status: DC

## 2018-05-03 MED ORDER — ATORVASTATIN CALCIUM 80 MG PO TABS: 80.0000 mg | ORAL_TABLET | Freq: Every day | ORAL | 2 refills | Status: DC

## 2018-05-03 MED ORDER — LISINOPRIL 40 MG PO TABS: 40.0000 mg | ORAL_TABLET | Freq: Every day | ORAL | 0 refills | Status: DC

## 2018-05-03 MED ORDER — AMLODIPINE BESYLATE 5 MG PO TABS: 5.0000 mg | ORAL_TABLET | Freq: Every day | ORAL | 2 refills | Status: DC

## 2018-05-03 MED ORDER — POTASSIUM CHLORIDE CRYS ER 20 MEQ PO TBCR: 20.0000 meq | EXTENDED_RELEASE_TABLET | Freq: Every day | ORAL | 2 refills | Status: DC

## 2018-05-03 NOTE — Telephone Encounter (Signed)
Pt's medications was sent to his pharmacy as requested. Confirmation received.

## 2018-05-03 NOTE — Telephone Encounter (Signed)
New message:        *STAT* If patient is at the pharmacy, call can be transferred to refill team.   1. Which medications need to be refilled? (please list name of each medication and dose if known)  atorvastatin (LIPITOR) 80 MG tablet  amLODipine (NORVASC) 5 MG tablet(Expired)  ezetimibe (ZETIA) 10 MG tablet  lisinopril (PRINIVIL,ZESTRIL) 40 MG tablet  potassium chloride SA (KLOR-CON M20) 20 MEQ tablet 2. Which pharmacy/location (including street and city if local pharmacy) is medication to be sent to? [facility] - [place], [place] - [address]  3. Do they need a 30 day or 90 day supply? 30

## 2018-06-01 ENCOUNTER — Other Ambulatory Visit: Payer: Self-pay | Admitting: Cardiology

## 2018-06-01 DIAGNOSIS — Z9119 Patient's noncompliance with other medical treatment and regimen: Secondary | ICD-10-CM

## 2018-06-01 DIAGNOSIS — I639 Cerebral infarction, unspecified: Secondary | ICD-10-CM

## 2018-06-01 DIAGNOSIS — I10 Essential (primary) hypertension: Secondary | ICD-10-CM

## 2018-06-01 DIAGNOSIS — Z91199 Patient's noncompliance with other medical treatment and regimen due to unspecified reason: Secondary | ICD-10-CM

## 2018-07-05 ENCOUNTER — Encounter: Payer: Self-pay | Admitting: Cardiology

## 2018-07-19 ENCOUNTER — Encounter: Payer: Self-pay | Admitting: Cardiology

## 2018-07-19 ENCOUNTER — Ambulatory Visit (INDEPENDENT_AMBULATORY_CARE_PROVIDER_SITE_OTHER): Payer: Self-pay | Admitting: Cardiology

## 2018-07-19 ENCOUNTER — Telehealth: Payer: Self-pay | Admitting: *Deleted

## 2018-07-19 VITALS — BP 160/90 | HR 55 | Ht 68.0 in | Wt 266.1 lb

## 2018-07-19 DIAGNOSIS — I2510 Atherosclerotic heart disease of native coronary artery without angina pectoris: Secondary | ICD-10-CM

## 2018-07-19 DIAGNOSIS — I10 Essential (primary) hypertension: Secondary | ICD-10-CM

## 2018-07-19 DIAGNOSIS — I119 Hypertensive heart disease without heart failure: Secondary | ICD-10-CM

## 2018-07-19 DIAGNOSIS — I43 Cardiomyopathy in diseases classified elsewhere: Secondary | ICD-10-CM

## 2018-07-19 DIAGNOSIS — G4733 Obstructive sleep apnea (adult) (pediatric): Secondary | ICD-10-CM

## 2018-07-19 DIAGNOSIS — E7800 Pure hypercholesterolemia, unspecified: Secondary | ICD-10-CM

## 2018-07-19 NOTE — Patient Instructions (Signed)
Medication Instructions:  Your physician recommends that you continue on your current medications as directed. Please refer to the Current Medication list given to you today.   If you need a refill on your cardiac medications before your next appointment, please call your pharmacy.   Lab work: Today: BMET, LFT and Lipids  If you have labs (blood work) drawn today and your tests are completely normal, you will receive your results only by: . MyChart Message (if you have MyChart) OR . A paper copy in the mail If you have any lab test that is abnormal or we need to change your treatment, we will call you to review the results.  Follow-Up: At [facility], you and your health needs are our priority.  As part of our continuing mission to provide you with exceptional heart care, we have created designated Provider Care Teams.  These Care Teams include your primary Cardiologist (physician) and Advanced Practice Providers (APPs -  Physician Assistants and Nurse Practitioners) who all work together to provide you with the care you need, when you need it. You will need a follow up appointment in 1 years.  Please call our office 2 months in advance to schedule this appointment.  You may see Traci Turner, MD or one of the following Advanced Practice Providers on your designated Care Team:   Brittainy Simmons, PA-C Dayna Dunn, PA-C . Michele Lenze, PA-C    

## 2018-07-19 NOTE — Progress Notes (Signed)
Cardiology Office Note:    Date:  07/19/2018   ID:  Scott Richardson, DOB [birth year], MRN [id number]  PCP:  Scott Miyamoto, MD  Cardiologist:  Scott Magic, MD    Referring MD: Scott Richardson,*   Chief Complaint  Patient presents with  . Coronary Artery Disease  . Hypertension  . Hyperlipidemia    History of Present Illness:    Scott Richardson is a 62 y.o. male with a hx of mild OSA with PSG showing an AHI of 11/hr on CPAP but he never got the CPAP device.  He has a history of ASCAD with coronary CTA showing occluded RCA with distal collaterals and otherwise nonobstructive disease.  He has a history of hypertensive DCM which resolved on aggressive medical therapy of his HTN.  He is here today for followup and is doing well.  He denies any chest pain or pressure, SOB, DOE, PND, orthopnea, LE edema, dizziness, palpitations or syncope. He is compliant with his meds and is tolerating meds with no SE.    Past Medical History:  Diagnosis Date  . CAD (coronary artery disease), native coronary artery    coronary CTA with occluded RCA with distal collaterals and otherwise nonobstructive disease.  Marland Kitchen HLD (hyperlipidemia)   . HTN (hypertension)   . LVH (left ventricular hypertrophy)   . Noncompliance   . Obesity   . Stroke [facility])     Past Surgical History:  Procedure Laterality Date  . NO PAST SURGERIES    . TEE WITHOUT CARDIOVERSION N/A 12/01/2013   Procedure: TRANSESOPHAGEAL ECHOCARDIOGRAM (TEE);  Surgeon: Vesta Mixer, MD;  Location: [facility] ENDOSCOPY;  Service: Cardiovascular;  Laterality: N/A;    Current Medications: Current Meds  Medication Sig  . amLODipine (NORVASC) 5 MG tablet Take 1 tablet (5 mg total) by mouth daily. Please keep upcoming appt in October for future refills. Thank you  . aspirin EC 81 MG tablet Take 1 tablet (81 mg total) by mouth daily.  Marland Kitchen atorvastatin (LIPITOR) 80 MG tablet Take 1 tablet (80 mg total) by mouth daily. Please keep upcoming appt in  October for future refills. Thank you  . clopidogrel (PLAVIX) 75 MG tablet Take 1 tablet (75 mg total) by mouth daily.  Marland Kitchen ezetimibe (ZETIA) 10 MG tablet Take 1 tablet (10 mg total) by mouth daily. Please keep upcoming appt in October for future refills. Thank you  . hydrochlorothiazide (HYDRODIURIL) 25 MG tablet Take 1 tablet (25 mg total) by mouth daily.  . Levothyroxine Sodium 88 MCG CAPS Take 1 capsule (88 mcg total) by mouth daily before breakfast.  . lisinopril (PRINIVIL,ZESTRIL) 40 MG tablet Take 1 tablet (40 mg total) by mouth daily. Please keep upcoming appt in October for future refills. Thank you  . metoprolol tartrate (LOPRESSOR) 12.5 mg TABS tablet Take 0.5 tablets (12.5 mg total) by mouth 2 (two) times daily.  . potassium chloride SA (KLOR-CON M20) 20 MEQ tablet Take 1 tablet (20 mEq total) by mouth daily. Please keep upcoming appt in October for future refills. Thank you  . sertraline (ZOLOFT) 50 MG tablet Take 1 tablet (50 mg total) by mouth daily.     Allergies:   Patient has no known allergies.   Social History   Socioeconomic History  . Marital status: Married    Spouse name: Scott Richardson  . Number of children: 2  . Years of education: HS  . Highest education level: Not on file  Occupational History    Employer: OTHER  Comment: n/a  Social Needs  . Financial resource strain: Not on file  . Food insecurity:    Worry: Not on file    Inability: Not on file  . Transportation needs:    Medical: Not on file    Non-medical: Not on file  Tobacco Use  . Smoking status: Never Smoker  . Smokeless tobacco: Never Used  Substance and Sexual Activity  . Alcohol use: No    Alcohol/week: 0.0 standard drinks  . Drug use: No  . Sexual activity: Not Currently  Lifestyle  . Physical activity:    Days per week: Not on file    Minutes per session: Not on file  . Stress: Not on file  Relationships  . Social connections:    Talks on phone: Not on file    Gets together: Not  on file    Attends religious service: Not on file    Active member of club or organization: Not on file    Attends meetings of clubs or organizations: Not on file    Relationship status: Not on file  Other Topics Concern  . Not on file  Social History Narrative   Patient is married with 2 children.   Patient is right handed.   Patient has hs education.   Patient drinks 1-2 cups daily.     Family History: The patient's family history includes Blindness in his maternal aunt, maternal aunt, maternal aunt, and maternal aunt; Hypertension in his father and mother.  ROS:   Please see the history of present illness.    ROS  All other systems reviewed and negative.   EKGs/Labs/Other Studies Reviewed:    The following studies were reviewed today: none  EKG:  EKG is  ordered today.  The ekg ordered today demonstrates sinus bradycardia 55 bpm with right bundle branch block aberration unchanged from 2018.  Recent Labs: No results found for requested labs within last 8760 hours.   Recent Lipid Panel    Component Value Date/Time   CHOL 118 10/30/2016 1057   TRIG 64 10/30/2016 1057   HDL 33 (L) 10/30/2016 1057   CHOLHDL 3.6 10/30/2016 1057   CHOLHDL 3.5 11/19/2015 0850   VLDL 15 11/19/2015 0850   LDLCALC 72 10/30/2016 1057    Physical Exam:    VS:  BP (!) 160/90   Pulse (!) 55   Ht 5\' 8"  (1.727 m)   Wt 266 lb 1.9 oz (120.7 kg)   BMI 40.46 kg/m     Wt Readings from Last 3 Encounters:  07/19/18 266 lb 1.9 oz (120.7 kg)  10/30/16 256 lb 12.8 oz (116.5 kg)  08/29/15 262 lb (118.8 kg)     GEN:  Well nourished, well developed in no acute distress HEENT: Normal NECK: No JVD; No carotid bruits LYMPHATICS: No lymphadenopathy CARDIAC: RRR, no murmurs, rubs, gallops RESPIRATORY:  Clear to auscultation without rales, wheezing or rhonchi  ABDOMEN: Soft, non-tender, non-distended MUSCULOSKELETAL:  No edema; No deformity  SKIN: Warm and dry NEUROLOGIC:  Alert and oriented x  3 PSYCHIATRIC:  Normal affect   ASSESSMENT:    1. Coronary artery disease involving native coronary artery of native heart without angina pectoris   2. Essential hypertension   3. 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 OSA (obstructive sleep apnea)   5. Pure hypercholesterolemia    PLAN:    In order of problems listed above:  1.  ASCAD - coronary CTA showed occluded RCA with distal  collaterals and otherwise nonobstructive disease.  He has not had any anginal symptoms.  He will continue on aspirin 81 mg daily, Plavix 75 mg daily, high-dose statin and beta-blocker.  2.  HTN -BP is elevated on exam today but he has not taken his medications yet.   He will continue on Lopressor 12.5 mg twice daily, HCTZ 25 mg daily, amlodipine 5 mg daily and lisinopril 40 mg daily.  I will check a bmet today.  3.  Hypertensive DCM -LV function normalized with EF 55 to 60% by echo 2015.  4.  OSA -he is still not gotten his CPAP device.  I am not sure what the issue is been but I will get my sleep nurse involved to try to figure out why the DME did not contact him about getting set up.  5.  Hyperlipidemia -his LDL goal is less than 70.  His LDL was 100 on 05/01/2017.  I will repeat an FLP and ALT today.  He will continue on Zetia 10 mg daily and atorvastatin 80 mg daily.   Medication Adjustments/Labs and Tests Ordered: Current medicines are reviewed at length with the patient today.  Concerns regarding medicines are outlined above.  No orders of the defined types were placed in this encounter.  No orders of the defined types were placed in this encounter.   Signed, Scott Magic, MD  07/19/2018 11:06 AM    [facility]

## 2018-07-19 NOTE — Telephone Encounter (Signed)
Patient's sleep study is too old. He had both done in Oct and Dec of 2015. Please write an order for a Home Sleep study as patient does not have insurance and is willing to private pay.

## 2018-07-20 LAB — BASIC METABOLIC PANEL
BUN/Creatinine Ratio: 15 (ref 10–24)
BUN: 16 mg/dL (ref 8–27)
CO2: 21 mmol/L (ref 20–29)
Calcium: 9.7 mg/dL (ref 8.6–10.2)
Chloride: 102 mmol/L (ref 96–106)
Creatinine, Ser: 1.04 mg/dL (ref 0.76–1.27)
GFR, EST AFRICAN AMERICAN: 89 mL/min/{1.73_m2} (ref >59)
GFR, EST NON AFRICAN AMERICAN: 77 mL/min/{1.73_m2} (ref >59)
Glucose: 110 mg/dL — ABNORMAL HIGH (ref 65–99)
POTASSIUM: 4.3 mmol/L (ref 3.5–5.2)
SODIUM: 140 mmol/L (ref 134–144)

## 2018-07-20 LAB — LIPID PANEL
CHOL/HDL RATIO: 3.7 ratio (ref 0.0–5.0)
Cholesterol, Total: 117 mg/dL (ref 100–199)
HDL: 32 mg/dL — ABNORMAL LOW (ref >39)
LDL Calculated: 72 mg/dL (ref 0–99)
TRIGLYCERIDES: 66 mg/dL (ref 0–149)
VLDL Cholesterol Cal: 13 mg/dL (ref 5–40)

## 2018-07-20 LAB — HEPATIC FUNCTION PANEL
ALBUMIN: 4.3 g/dL (ref 3.6–4.8)
ALT: 31 IU/L (ref 0–44)
AST: 25 IU/L (ref 0–40)
Alkaline Phosphatase: 70 IU/L (ref 39–117)
Bilirubin Total: 0.9 mg/dL (ref 0.0–1.2)
Bilirubin, Direct: 0.21 mg/dL (ref 0.00–0.40)
Total Protein: 7.1 g/dL (ref 6.0–8.5)

## 2018-07-21 NOTE — Telephone Encounter (Addendum)
  Scott Margarita, MD  Freada Bergeron, CMA        Please order home sleep study   [name]      Patient is aware and agreeable to Home Sleep Study through [facility]. Patient is scheduled for 08/06/18 at 10:00 to pick up home sleep kit and meet with Respiratory therapist at [facility]. Patient is aware that if this appointment date and time does not work for them they should contact Artis Delay directly at [phone]. Patient is aware that a sleep packet will be sent from [facility] in week.  Left detailed message on voicemail with date and time of test and informed patient to call back to confirm or reschedule.

## 2018-07-21 NOTE — Addendum Note (Signed)
Addended by: Reesa Chew on: 07/21/2018 08:50 AM   Modules accepted: Orders

## 2018-08-06 ENCOUNTER — Ambulatory Visit (HOSPITAL_BASED_OUTPATIENT_CLINIC_OR_DEPARTMENT_OTHER): Payer: Self-pay | Attending: Cardiology | Admitting: Cardiology

## 2018-08-06 DIAGNOSIS — R0902 Hypoxemia: Secondary | ICD-10-CM | POA: Insufficient documentation

## 2018-08-06 DIAGNOSIS — G4733 Obstructive sleep apnea (adult) (pediatric): Secondary | ICD-10-CM | POA: Insufficient documentation

## 2018-08-10 NOTE — Procedures (Signed)
   Patient Name: Scott Richardson, Scott Richardson Date: 08/06/2018 Gender: Male D.O.B: 1956/04/23 Age (years): 73 Referring Provider: Armanda Magic MD, ABSM Height (inches): 68 Interpreting Physician: Armanda Magic MD, ABSM Weight (lbs): 266 RPSGT: Pepper Pike Sink BMI: 40 MRN: [id number] Neck Size: 19.00  CLINICAL INFORMATION  Sleep Study Type: HST  Indication for sleep study: OSA  Epworth Sleepiness Score: 3  SLEEP STUDY TECHNIQUE  A multi-channel overnight portable sleep study was performed. The channels recorded were: nasal airflow, thoracic respiratory movement, and oxygen saturation with a pulse oximetry. Snoring was also monitored.  MEDICATIONS  Patient self administered medications include: N/A.  SLEEP ARCHITECTURE   Patient was studied for 386.2 minutes. The sleep efficiency was 100.0 % and the patient was supine for 98.1%. The arousal index was 0.0 per hour.  RESPIRATORY PARAMETERS  The overall AHI was 21.3 per hour, with a central apnea index of 0.0 per hour. The oxygen nadir was 76% during sleep.  CARDIAC DATA  Mean heart rate during sleep was 52.0 bpm.  IMPRESSIONS  - Moderate obstructive sleep apnea occurred during this study (AHI = 21.3/h). - No significant central sleep apnea occurred during this study (CAI = 0.0/h). - Severe oxygen desaturation was noted during this study (Min O2 = 76%).  41% of total sleep time was spent with oxygen saturations < 89%. - Patient snored 40.8% during the sleep.  DIAGNOSIS  - Obstructive Sleep Apnea (327.23 [G47.33 ICD-10]) - Nocturnal Hypoxemia (327.26 [G47.36 ICD-10])  RECOMMENDATIONS  - Recommend in lab CPAP titration due to degree of sleep disordered breathing and hypoxemia. - Positional therapy avoiding supine position during sleep. - Avoid alcohol, sedatives and other CNS depressants that may worsen sleep apnea and disrupt normal sleep architecture. - Sleep hygiene should be reviewed to assess factors that may improve sleep  quality. - Weight management and regular exercise should be initiated or continued.  [Electronically signed] 08/10/2018 07:27 PM  Armanda Magic MD, ABSM Diplomate, American Board of Sleep Medicine

## 2018-08-17 ENCOUNTER — Telehealth: Payer: Self-pay | Admitting: *Deleted

## 2018-08-17 ENCOUNTER — Encounter: Payer: Self-pay | Admitting: *Deleted

## 2018-08-17 NOTE — Telephone Encounter (Signed)
-----   Message from Quintella Reichert, MD sent at 08/10/2018  7:29 PM EST ----- Please let patient know that they have sleep apnea and recommend CPAP titration. Please set up titration in the sleep lab.

## 2018-08-17 NOTE — Telephone Encounter (Signed)
Patient is scheduled for CPAP Titration on 10/06/2018. Patient understands his titration study will be done at [facility] sleep lab. Patient/Wife  understands he will self pay for his cpap titration and will make the needed arrangements. Patient understands he will receive a letter in a week or so detailing appointment, date, time, and location. Patient understands to call if he does not receive the letter  in a timely manner. Patient/Wife agrees with treatment and thanked me for call.

## 2018-08-17 NOTE — Addendum Note (Signed)
Addended by: Reesa Chew on: 08/17/2018 10:14 AM   Modules accepted: Orders

## 2018-08-17 NOTE — Telephone Encounter (Signed)
Per dpr Informed wife/ patient of sleep study results and patient understanding was verbalized. Wife/Patient understands his sleep study showed they have sleep apnea and the doctor recommends a CPAP titration. Patient will be set up titration in the sleep lab.

## 2018-08-24 ENCOUNTER — Encounter: Payer: Self-pay | Admitting: *Deleted

## 2018-10-06 ENCOUNTER — Ambulatory Visit (HOSPITAL_BASED_OUTPATIENT_CLINIC_OR_DEPARTMENT_OTHER): Payer: Self-pay | Attending: Cardiology | Admitting: Cardiology

## 2018-10-06 VITALS — Ht 68.0 in | Wt 258.0 lb

## 2018-10-06 DIAGNOSIS — G4733 Obstructive sleep apnea (adult) (pediatric): Secondary | ICD-10-CM | POA: Insufficient documentation

## 2018-10-17 NOTE — Procedures (Signed)
   Patient Name: Scott Richardson, Scott Richardson Date: 10/06/2018 Gender: Male D.O.B: Oct 19, 1955 Age (years): 25 Referring Provider: Armanda Magic MD, ABSM Height (inches): 68 Interpreting Physician: Armanda Magic MD, ABSM Weight (lbs): 258 RPSGT: Shelah Lewandowsky BMI: 39 MRN: [id number] Neck Size: 19.00  CLINICAL INFORMATION The patient is referred for a CPAP titration to treat sleep apnea.  SLEEP STUDY TECHNIQUE As per the AASM Manual for the Scoring of Sleep and Associated Events v2.3 (April 2016) with a hypopnea requiring 4% desaturations.  The channels recorded and monitored were frontal, central and occipital EEG, electrooculogram (EOG), submentalis EMG (chin), nasal and oral airflow, thoracic and abdominal wall motion, anterior tibialis EMG, snore microphone, electrocardiogram, and pulse oximetry. Continuous positive airway pressure (CPAP) was initiated at the beginning of the study and titrated to treat sleep-disordered breathing.  MEDICATIONS Medications self-administered by patient taken the night of the study : N/A  TECHNICIAN COMMENTS Comments added by technician: NONE Comments added by scorer: N/A  RESPIRATORY PARAMETERS Optimal PAP Pressure (cm): 14  AHI at Optimal Pressure (/hr):0.8 Overall Minimal O2 (%):87.0  Supine % at Optimal Pressure (%):100 Minimal O2 at Optimal Pressure (%): 92.0   SLEEP ARCHITECTURE The study was initiated at 10:42:52 PM and ended at 4:45:31 AM.  Sleep onset time was 17.4 minutes and the sleep efficiency was 92.2%%. The total sleep time was 334.5 minutes.  The patient spent 4.6%% of the night in stage N1 sleep, 68.2%% in stage N2 sleep, 0.0%% in stage N3 and 27.2% in REM.Stage REM latency was 53.5 minutes  Wake after sleep onset was 10.7. Alpha intrusion was absent. Supine sleep was 74.59%.  CARDIAC DATA The 2 lead EKG demonstrated sinus rhythm. The mean heart rate was 47.3 beats per minute. Other EKG findings include: PVCs.  LEG MOVEMENT  DATA The total Periodic Limb Movements of Sleep (PLMS) were 0. The PLMS index was 0.0. A PLMS index of <15 is considered normal in adults.  IMPRESSIONS - The optimal PAP pressure was 14 cm of water. - Central sleep apnea was not noted during this titration (CAI = 0.0/h). - Mild oxygen desaturations were observed during this titration (min O2 = 87.0%). - The patient snored with moderate snoring volume during this titration study. - 2-lead EKG demonstrated: PVCs - Clinically significant periodic limb movements were not noted during this study. Arousals associated with PLMs were rare.  DIAGNOSIS - Obstructive Sleep Apnea (327.23 [G47.33 ICD-10])  RECOMMENDATIONS - Trial of CPAP therapy on 14 cm H2O with a Medium size Fisher&Paykel Full Face Mask Simplus mask and heated humidification. - Avoid alcohol, sedatives and other CNS depressants that may worsen sleep apnea and disrupt normal sleep architecture. - Sleep hygiene should be reviewed to assess factors that may improve sleep quality. - Weight management and regular exercise should be initiated or continued. - Return to Sleep Center for re-evaluation after 10 weeks of therapy  [Electronically signed] 10/17/2018 08:27 PM  Armanda Magic MD, ABSM Diplomate, American Board of Sleep Medicine

## 2018-10-19 NOTE — Telephone Encounter (Signed)
Per DPR Informed patient/Wife of titration results and verbalized understanding was indicated. Patient/Wife understands his titration showed  they had a successful PAP titration and orders are in EPIC. Please set up 10 week OV with me.   Wife states the patient does not have insurance and would like to self pay. I will reach out to [facility] at [facility] to see how she wants to proceed.

## 2018-10-21 ENCOUNTER — Telehealth: Payer: Self-pay | Admitting: *Deleted

## 2018-10-21 NOTE — Telephone Encounter (Signed)
-----   Message from Quintella Reichert, MD sent at 10/17/2018  8:30 PM EST ----- Please let patient know that they had a successful PAP titration and let DME know that orders are in EPIC.  Please set up 10 week OV with me.

## 2018-10-21 NOTE — Telephone Encounter (Signed)
Upon patient/Wife request DME selection is [facility]. Patient/Wife understands he will be contacted by [facility] to set up his cpap. Patient understands to call if [facility] does not contact him with new setup in a timely manner. Patient understands they will be called once confirmation has been received from [facility] that they have received their new machine to schedule 10 week follow up appointment.  [facility] notified of new cpap order  Please add to airview Patient was grateful for the call and thanked me. 

## 2018-10-21 NOTE — Telephone Encounter (Signed)
Upon patient/Wife request DME selection is [facility]. Patient/Wife understands he will be contacted by [facility] to set up his cpap. Patient understands to call if [facility] does not contact him with new setup in a timely manner. Patient understands they will be called once confirmation has been received from [facility] that they have received their new machine to schedule 10 week follow up appointment.  [facility] notified of new cpap order  Please add to airview Patient was grateful for the call and thanked me.

## 2018-11-02 ENCOUNTER — Telehealth: Payer: Self-pay | Admitting: Cardiology

## 2018-11-02 NOTE — Telephone Encounter (Signed)
Reached out to patients wife per DPR and assured her I will reach out to [facility] on 11/05/18 to ask them to call the patient concerning his cpap machine and them private paying for it. Pt/wife is aware and agreeable to treatment.

## 2018-11-02 NOTE — Telephone Encounter (Signed)
New Message   Patient's wife calling stating someone was suppose to contact them about CPAP machine and no one has called yet and she wants the number so she can call them.

## 2019-01-23 ENCOUNTER — Other Ambulatory Visit: Payer: Self-pay | Admitting: Cardiology

## 2019-01-23 DIAGNOSIS — I639 Cerebral infarction, unspecified: Secondary | ICD-10-CM

## 2019-01-23 DIAGNOSIS — Z91199 Patient's noncompliance with other medical treatment and regimen due to unspecified reason: Secondary | ICD-10-CM

## 2019-01-23 DIAGNOSIS — I10 Essential (primary) hypertension: Secondary | ICD-10-CM

## 2019-01-23 DIAGNOSIS — Z9119 Patient's noncompliance with other medical treatment and regimen: Secondary | ICD-10-CM

## 2019-08-17 NOTE — Telephone Encounter (Signed)
Patient called [facility] in March and cancelled his cpap appointment because he did not have the finances to afford the machine and he has not called back to reschedule.

## 2019-08-17 NOTE — Telephone Encounter (Signed)
Do we still have that assistance program that gives patient's refurbished machines?

## 2019-08-18 NOTE — Progress Notes (Signed)
Cardiology Office Note:    Date:  08/19/2019   ID:  Scott Richardson, DOB 1956-06-05, MRN [id number]  PCP:  Abigail Miyamoto, MD  Cardiologist:  Armanda Magic, MD    Referring MD: Abigail Miyamoto,*   Chief Complaint  Patient presents with  . Coronary Artery Disease  . Hypertension  . Hyperlipidemia    History of Present Illness:    Scott Richardson is a 63 y.o. male with a hx of  ASCAD with coronary CTA showing occluded RCA with distal collaterals and otherwise nonobstructive disease.  He has a history of hypertensive DCM which resolved on aggressive medical therapy of his HTN. He recently had a sleep study done showing  moderate OSA with an   he is here today for followup and is doing well.  He denies any chest pain or pressure, SOB, DOE, PND, orthopnea, LE edema, dizziness, palpitations or syncope. He is compliant with his meds and is tolerating meds with no SE.    Past Medical History:  Diagnosis Date  . CAD (coronary artery disease), native coronary artery    coronary CTA with occluded RCA with distal collaterals and otherwise nonobstructive disease.  Marland Kitchen HLD (hyperlipidemia)   . HTN (hypertension)   . LVH (left ventricular hypertrophy)   . Noncompliance   . Obesity   . Stroke [facility])     Past Surgical History:  Procedure Laterality Date  . NO PAST SURGERIES    . TEE WITHOUT CARDIOVERSION N/A 12/01/2013   Procedure: TRANSESOPHAGEAL ECHOCARDIOGRAM (TEE);  Surgeon: Vesta Mixer, MD;  Location: [facility] ENDOSCOPY;  Service: Cardiovascular;  Laterality: N/A;    Current Medications: Current Meds  Medication Sig  . aspirin EC 81 MG tablet Take 1 tablet (81 mg total) by mouth daily.  Marland Kitchen atorvastatin (LIPITOR) 80 MG tablet Take 1 tablet (80 mg total) by mouth daily. Please keep upcoming appt in October for future refills. Thank you  . clopidogrel (PLAVIX) 75 MG tablet Take 1 tablet (75 mg total) by mouth daily.  Marland Kitchen ezetimibe (ZETIA) 10 MG tablet TAKE 1 TABLET BY MOUTH ONCE  DAILY. PLEASE  KEEP  UPCOMING  APPT  IN  OCTOBER  FOR  FUTURE  REFILLS.  . hydrochlorothiazide (HYDRODIURIL) 25 MG tablet Take 1 tablet (25 mg total) by mouth daily.  . Levothyroxine Sodium 88 MCG CAPS Take 1 capsule (88 mcg total) by mouth daily before breakfast.  . lisinopril (ZESTRIL) 40 MG tablet TAKE 1 TABLET BY MOUTH ONCE DAILY. PLEASE KEEP UPCOMING APPT IN OCTOBER FOR FUTURE REFILLS.  Marland Kitchen potassium chloride SA (KLOR-CON M20) 20 MEQ tablet Take 1 tablet (20 mEq total) by mouth daily. Please keep upcoming appt in October for future refills. Thank you  . sertraline (ZOLOFT) 50 MG tablet Take 1 tablet (50 mg total) by mouth daily.  . [DISCONTINUED] amLODipine (NORVASC) 5 MG tablet Take 1 tablet (5 mg total) by mouth daily. Please keep upcoming appt in October for future refills. Thank you  . [DISCONTINUED] metoprolol tartrate (LOPRESSOR) 12.5 mg TABS tablet Take 0.5 tablets (12.5 mg total) by mouth 2 (two) times daily.     Allergies:   Patient has no known allergies.   Social History   Socioeconomic History  . Marital status: Married    Spouse name: Scott Richardson  . Number of children: 2  . Years of education: HS  . Highest education level: Not on file  Occupational History    Employer: OTHER    Comment: n/a  Social Needs  .  Financial resource strain: Not on file  . Food insecurity    Worry: Not on file    Inability: Not on file  . Transportation needs    Medical: Not on file    Non-medical: Not on file  Tobacco Use  . Smoking status: Never Smoker  . Smokeless tobacco: Never Used  Substance and Sexual Activity  . Alcohol use: No    Alcohol/week: 0.0 standard drinks  . Drug use: No  . Sexual activity: Not Currently  Lifestyle  . Physical activity    Days per week: Not on file    Minutes per session: Not on file  . Stress: Not on file  Relationships  . Social Herbalist on phone: Not on file    Gets together: Not on file    Attends religious service: Not on  file    Active member of club or organization: Not on file    Attends meetings of clubs or organizations: Not on file    Relationship status: Not on file  Other Topics Concern  . Not on file  Social History Narrative   Patient is married with 2 children.   Patient is right handed.   Patient has hs education.   Patient drinks 1-2 cups daily.     Family History: The patient's family history includes Blindness in his maternal aunt, maternal aunt, maternal aunt, and maternal aunt; Hypertension in his father and mother.  ROS:   Please see the history of present illness.    ROS  All other systems reviewed and negative.   EKGs/Labs/Other Studies Reviewed:    The following studies were reviewed today: PAP compliance download  EKG:  EKG is  ordered today.  The ekg ordered today demonstrates sinus bradycardia at 56bpm with RBBB  Recent Labs: No results found for requested labs within last 8760 hours.   Recent Lipid Panel    Component Value Date/Time   CHOL 117 07/19/2018 1126   TRIG 66 07/19/2018 1126   HDL 32 (L) 07/19/2018 1126   CHOLHDL 3.7 07/19/2018 1126   CHOLHDL 3.5 11/19/2015 0850   VLDL 15 11/19/2015 0850   LDLCALC 72 07/19/2018 1126    Physical Exam:    VS:  BP (!) 196/98   Pulse 62   Ht 5\' 8"  (1.727 m)   Wt 275 lb 1.9 oz (124.8 kg)   SpO2 96%   BMI 41.83 kg/m     Wt Readings from Last 3 Encounters:  08/19/19 275 lb 1.9 oz (124.8 kg)  10/06/18 258 lb (117 kg)  07/19/18 266 lb 1.9 oz (120.7 kg)     GEN:  Well nourished, well developed in no acute distress HEENT: Normal NECK: No JVD; No carotid bruits LYMPHATICS: No lymphadenopathy CARDIAC: RRR, no murmurs, rubs, gallops RESPIRATORY:  Clear to auscultation without rales, wheezing or rhonchi  ABDOMEN: Soft, non-tender, non-distended MUSCULOSKELETAL:  No edema; No deformity  SKIN: Warm and dry NEUROLOGIC:  Alert and oriented x 3 PSYCHIATRIC:  Normal affect   ASSESSMENT:    1. Coronary artery  disease involving native coronary artery of native heart without angina pectoris   2. Essential hypertension   3. Cardiomyopathy due to hypertension, without heart failure ([place])   4. OSA (obstructive sleep apnea)   5. Pure hypercholesterolemia   6. Medication management    PLAN:    In order of problems listed above:  1.  ASCAD -coronary CTA showed occluded RCA with distal collaterals and otherwise nonobstructive disease.  -  he denies any anginal sx -continue on ASA 81mg  daily, Plavix 75mg  daily, statin and BB.  2.  HTN -BP is poorly controlled.  He does not check his BP at home. -continue HCTZ 25mg  daily and Lisinopril 40mg  daily -increase amlodipine to 10mg  daily -change Lopressor to Carvedilol 6.25mg  BID -HTN clinic in 1 week -creatinine was 1.13 on 07/20/2019  3.  Hypertensive DCM -EF normalized on echo 2015 with EF 55-60%  4.  OSA - The patient has moderate OSA and underwent PAP titration but he has no insurance and cannot afford his device.  I will see if we can get any patient assistance for him and get him info on how to apply for Medicaid.    5.  Hyperlipidemia -LDL goal < 70 -check FLp and ALT -continue Zetial 10mg  daily and atorvastatin 80mg  daily   Medication Adjustments/Labs and Tests Ordered: Current medicines are reviewed at length with the patient today.  Concerns regarding medicines are outlined above.  Orders Placed This Encounter  Procedures  . ALT  . Lipid Profile  . EKG 12-Lead   Meds ordered this encounter  Medications  . carvedilol (COREG) 6.25 MG tablet    Sig: Take 1 tablet (6.25 mg total) by mouth 2 (two) times daily.    Dispense:  180 tablet    Refill:  3  . amLODipine (NORVASC) 10 MG tablet    Sig: Take 1 tablet (10 mg total) by mouth daily.    Dispense:  90 tablet    Refill:  3    Signed, Armanda Magic, MD  08/19/2019 10:17 AM    [facility]

## 2019-08-18 NOTE — Telephone Encounter (Signed)
Please keep him on a list to followed monthly to get a machine once the assistance program is up and running again

## 2019-08-18 NOTE — Telephone Encounter (Signed)
No Dr Radford Pax  that has stopped since covid 19. The voice message said they will announce when they resume the program.

## 2019-08-19 ENCOUNTER — Ambulatory Visit (INDEPENDENT_AMBULATORY_CARE_PROVIDER_SITE_OTHER): Payer: Self-pay | Admitting: Cardiology

## 2019-08-19 ENCOUNTER — Encounter: Payer: Self-pay | Admitting: Cardiology

## 2019-08-19 ENCOUNTER — Other Ambulatory Visit: Payer: Self-pay

## 2019-08-19 VITALS — BP 196/98 | HR 62 | Ht 68.0 in | Wt 275.1 lb

## 2019-08-19 DIAGNOSIS — I2510 Atherosclerotic heart disease of native coronary artery without angina pectoris: Secondary | ICD-10-CM

## 2019-08-19 DIAGNOSIS — I43 Cardiomyopathy in diseases classified elsewhere: Secondary | ICD-10-CM

## 2019-08-19 DIAGNOSIS — I119 Hypertensive heart disease without heart failure: Secondary | ICD-10-CM

## 2019-08-19 DIAGNOSIS — E7800 Pure hypercholesterolemia, unspecified: Secondary | ICD-10-CM

## 2019-08-19 DIAGNOSIS — I10 Essential (primary) hypertension: Secondary | ICD-10-CM

## 2019-08-19 DIAGNOSIS — G4733 Obstructive sleep apnea (adult) (pediatric): Secondary | ICD-10-CM

## 2019-08-19 DIAGNOSIS — Z79899 Other long term (current) drug therapy: Secondary | ICD-10-CM

## 2019-08-19 MED ORDER — AMLODIPINE BESYLATE 10 MG PO TABS: 10.0000 mg | ORAL_TABLET | Freq: Every day | ORAL | 3 refills | Status: DC

## 2019-08-19 MED ORDER — CARVEDILOL 6.25 MG PO TABS: 6.2500 mg | ORAL_TABLET | Freq: Two times a day (BID) | ORAL | 3 refills | Status: DC

## 2019-08-19 NOTE — Patient Instructions (Signed)
Medication Instructions:  Please discontinue your Lopressor. Increase Amlodipine to 10 mg daily. Start Carvedilol 6.25 mg twice daily. Continue all other medications as listed.  *If you need a refill on your cardiac medications before your next appointment, please call your pharmacy*  Lab Work: Please have fasting blood work in 1 week. (Lipid/ALT)  If you have labs (blood work) drawn today and your tests are completely normal, you will receive your results only by: Marland Kitchen MyChart Message (if you have MyChart) OR . A paper copy in the mail If you have any lab test that is abnormal or we need to change your treatment, we will call you to review the results.  Follow-Up: At [facility], you and your health needs are our priority.  As part of our continuing mission to provide you with exceptional heart care, we have created designated Provider Care Teams.  These Care Teams include your primary Cardiologist (physician) and Advanced Practice Providers (APPs -  Physician Assistants and Nurse Practitioners) who all work together to provide you with the care you need, when you need it.  Your next appointment:   3 month(s)  The format for your next appointment:   In Person  Provider:   [name], MD  Other Instructions You have been referred to the Hypertension Clinic, to be seen in 1 week.

## 2019-09-01 ENCOUNTER — Telehealth: Payer: Self-pay | Admitting: Licensed Clinical Social Worker

## 2019-09-01 ENCOUNTER — Other Ambulatory Visit: Payer: Self-pay

## 2019-09-01 ENCOUNTER — Ambulatory Visit (INDEPENDENT_AMBULATORY_CARE_PROVIDER_SITE_OTHER): Payer: Self-pay | Admitting: Pharmacist

## 2019-09-01 ENCOUNTER — Other Ambulatory Visit: Payer: Self-pay | Admitting: *Deleted

## 2019-09-01 ENCOUNTER — Telehealth: Payer: Self-pay | Admitting: Pharmacist

## 2019-09-01 ENCOUNTER — Encounter: Payer: Self-pay | Admitting: Pharmacist

## 2019-09-01 VITALS — BP 166/80 | HR 59

## 2019-09-01 DIAGNOSIS — I10 Essential (primary) hypertension: Secondary | ICD-10-CM

## 2019-09-01 DIAGNOSIS — Z79899 Other long term (current) drug therapy: Secondary | ICD-10-CM

## 2019-09-01 DIAGNOSIS — E7800 Pure hypercholesterolemia, unspecified: Secondary | ICD-10-CM

## 2019-09-01 LAB — LIPID PANEL
Chol/HDL Ratio: 5.1 ratio — ABNORMAL HIGH (ref 0.0–5.0)
Cholesterol, Total: 205 mg/dL — ABNORMAL HIGH (ref 100–199)
HDL: 40 mg/dL (ref >39)
LDL Chol Calc (NIH): 150 mg/dL — ABNORMAL HIGH (ref 0–99)
Triglycerides: 81 mg/dL (ref 0–149)
VLDL Cholesterol Cal: 15 mg/dL (ref 5–40)

## 2019-09-01 LAB — COMPREHENSIVE METABOLIC PANEL
ALT: 19 IU/L (ref 0–44)
AST: 16 IU/L (ref 0–40)
Albumin/Globulin Ratio: 1.5 (ref 1.2–2.2)
Albumin: 4.2 g/dL (ref 3.8–4.8)
Alkaline Phosphatase: 73 IU/L (ref 39–117)
BUN/Creatinine Ratio: 14 (ref 10–24)
BUN: 14 mg/dL (ref 8–27)
Bilirubin Total: 0.6 mg/dL (ref 0.0–1.2)
CO2: 22 mmol/L (ref 20–29)
Calcium: 9.6 mg/dL (ref 8.6–10.2)
Chloride: 101 mmol/L (ref 96–106)
Creatinine, Ser: 1 mg/dL (ref 0.76–1.27)
GFR calc Af Amer: 92 mL/min/{1.73_m2} (ref >59)
GFR calc non Af Amer: 80 mL/min/{1.73_m2} (ref >59)
Globulin, Total: 2.8 g/dL (ref 1.5–4.5)
Glucose: 115 mg/dL — ABNORMAL HIGH (ref 65–99)
Potassium: 4.3 mmol/L (ref 3.5–5.2)
Sodium: 137 mmol/L (ref 134–144)
Total Protein: 7 g/dL (ref 6.0–8.5)

## 2019-09-01 MED ORDER — HYDROCHLOROTHIAZIDE 25 MG PO TABS: 25.0000 mg | ORAL_TABLET | Freq: Every day | ORAL | 3 refills | Status: DC

## 2019-09-01 MED ORDER — ATORVASTATIN CALCIUM 80 MG PO TABS: 80.0000 mg | ORAL_TABLET | Freq: Every day | ORAL | 3 refills | Status: DC

## 2019-09-01 MED ORDER — EZETIMIBE 10 MG PO TABS: ORAL_TABLET | ORAL | 3 refills | Status: DC

## 2019-09-01 NOTE — Patient Instructions (Addendum)
It was a pleasure to meet you today!  1. I will have a Education officer, museum reach out to you about helping with medication cost and getting a blood pressure machine.  2. Continue taking amlodipine 10mg  daily, carvedilol 6.25mg  twice a day, lisinopril 40mg  daily, HCTZ 25mg  daily It is very important that you take these medications and you do not go without them  3. Apply for medicaid at https://ncgov.servicenowservices.com/sp_beneficiary?id=bnf_apply  4. Continue to exercise most days of the week. Try to slowly increase your walking time and pace to 30 min per day  5. Make sure you take your medications prior to next visit.  6. Start checking your blood pressure once a day once you get a meter and bring a log of your blood pressure readings to next visit.  If you have any questions or concerns please call us at [phone]

## 2019-09-01 NOTE — Progress Notes (Signed)
Patient ID: Scott Richardson                 DOB: [birth year]                      MRN: [id number]     HPI: Scott Richardson is a 63 y.o. male referred by Dr. Mayford Knife to HTN clinic. PMH is significant for HTN, HLD, stroke, ASCADwithcoronary CTAshowingoccluded RCA with distal collaterals and otherwise nonobstructive disease. He has a history of hypertensive DCM which resolved on aggressive medical therapy of his HTN. He recently had a sleep study done showing moderate OSA. Patient was seen on 08/19/2019 by Dr. Mayford Knife where his blood pressure was 196/98 HR 62. At that visit patients amlodipine was increased to 10mg  daily and his metoprolol was changed to carvedilol 6.25mg  twice a day.  Patient presents today to the HTN clinic for initial appointment. Patient states that there are 4 medications on his medication list that he does not have. One of them is a blood pressure medication, HCTZ and both his cholesterol medications atorvastatin and ezetimibe. I have sent in new prescriptions for all three to the pharmacy. I also advised him to resume taking these medications.   He states that he takes all his medications when he has them. When asked if he sometimes goes days or week without medications, he says sometimes. When I asked if he has trouble affording his meds he said sometimes. When asked if he has applied for medicaid, he states that he didn't because he thought Dr. was doing that for him. I advised that Dr. Mayford Knife was going to look into patient assistance for a CPAP machine, but she is unable to apply for Medicaid for a patient. Patient given link to apply online.  Patient denies any issues with the medication changes made by Dr. Mayford Knife. Patient denies dizziness, lightheadedness, headache, blurred vision, SOB or swelling. States that first thing in the AM he feels moving a little slower- little hazy- fine after awhile. Patient did not take his medications this AM because he was getting blood work. He  started walking again last week, walked everyday but Saturday and Sunday. States he walks for about 20 min. Cut out salt years ago.   Patient does not have a blood pressure cuff therefore does not check his blood pressure at home.   Current HTN meds: amlodipine 10mg  daily, carvedilol 6.25mg  twice a day, lisinopril 40mg  daily, HCTZ 25mg  daily Previously tried: metoprolol (changed to carvedilol for better BP lowering) BP goal: <130/80  Family History: The patient's family history includes Blindness in his maternal aunt, maternal aunt, maternal aunt, and maternal aunt; Hypertension in his father and mother.  Social History: Neg ETOH, neg tobacco  Diet: 1 cup of coffee per day, does not salt food, wife dose not cook with salt, sometimes eats TV dinner, Sunday restaurants twice a week  Exercise: started back walking last week- walks about 20 min every day but sat and Sunday.  Home BP readings: doesn't check  Wt Readings from Last 3 Encounters:  08/19/19 275 lb 1.9 oz (124.8 kg)  10/06/18 258 lb (117 kg)  07/19/18 266 lb 1.9 oz (120.7 kg)   BP Readings from Last 3 Encounters:  08/19/19 (!) 196/98  07/19/18 (!) 160/90  10/30/16 (!) 156/88   Pulse Readings from Last 3 Encounters:  08/19/19 62  07/19/18 (!) 55  10/30/16 (!) 54    Renal function: CrCl cannot be calculated (Patient's most recent  lab result is older than the maximum 21 days allowed.).  Past Medical History:  Diagnosis Date  . CAD (coronary artery disease), native coronary artery    coronary CTA with occluded RCA with distal collaterals and otherwise nonobstructive disease.  Marland Kitchen HLD (hyperlipidemia)   . HTN (hypertension)   . LVH (left ventricular hypertrophy)   . Noncompliance   . Obesity   . Stroke [facility])     Current Outpatient Medications on File Prior to Visit  Medication Sig Dispense Refill  . amLODipine (NORVASC) 10 MG tablet Take 1 tablet (10 mg total) by mouth daily. 90 tablet 3  . aspirin EC 81 MG tablet  Take 1 tablet (81 mg total) by mouth daily. 90 tablet 3  . atorvastatin (LIPITOR) 80 MG tablet Take 1 tablet (80 mg total) by mouth daily. Please keep upcoming appt in October for future refills. Thank you 60 tablet 2  . carvedilol (COREG) 6.25 MG tablet Take 1 tablet (6.25 mg total) by mouth 2 (two) times daily. 180 tablet 3  . clopidogrel (PLAVIX) 75 MG tablet Take 1 tablet (75 mg total) by mouth daily. 90 tablet 3  . ezetimibe (ZETIA) 10 MG tablet TAKE 1 TABLET BY MOUTH ONCE DAILY. PLEASE  KEEP  UPCOMING  APPT  IN  OCTOBER  FOR  FUTURE  REFILLS. 90 tablet 1  . hydrochlorothiazide (HYDRODIURIL) 25 MG tablet Take 1 tablet (25 mg total) by mouth daily. 30 tablet 0  . Levothyroxine Sodium 88 MCG CAPS Take 1 capsule (88 mcg total) by mouth daily before breakfast. 30 capsule 0  . lisinopril (ZESTRIL) 40 MG tablet TAKE 1 TABLET BY MOUTH ONCE DAILY. PLEASE KEEP UPCOMING APPT IN OCTOBER FOR FUTURE REFILLS. 90 tablet 1  . potassium chloride SA (KLOR-CON M20) 20 MEQ tablet Take 1 tablet (20 mEq total) by mouth daily. Please keep upcoming appt in October for future refills. Thank you 60 tablet 2  . sertraline (ZOLOFT) 50 MG tablet Take 1 tablet (50 mg total) by mouth daily. 90 tablet 3   No current facility-administered medications on file prior to visit.     No Known Allergies  There were no vitals taken for this visit.   Assessment/Plan:  1. Hypertension - Blood pressure is above goal of <130/80 in clinic today. Blood pressure improved slightly on second check to 166/50. He has not taken his blood pressure medications today. He also has not been taking HCTZ. Patient is to continue to take amlodipine 10mg  daily, carvedilol 6.25mg  twice a day, lisinopril 40mg  daily and resume HCTZ 25mg  daily. I have reached out to Raquel Sarna from social work via staff message to see if she can provide him some assistance with medication copay and if she can get him a blood pressure monitor. I have encouraged him to  continue to walk 5 days a week and slowly increase his time and pace up to 30 min per day. I have also asked him to check his blood pressure daily when he gets a blood pressure monitor and to bring it with him to next appointment. Follow up in 4 weeks in HTN clinic.   Thank you  Ramond Dial, Pharm.D, BCPS, CPP [facility]  [address] [place], [place] [address]  Phone: [phone]; Fax: [phone]

## 2019-09-01 NOTE — Telephone Encounter (Signed)
Attempted to call patient, spoke with his wife (on the [place]). She states that he is already back home but will be back in [place]. She will let him know to stop by office tomorrow to pick up gift card to help with medication copays.

## 2019-09-01 NOTE — Telephone Encounter (Signed)
CSW referred to assist patient with obtaining a BP cuff. CSW contacted patient to inform cuff will be delivered to home. Message left. CSW available as needed. Jackie Shamarie Call, LCSW, CCSW-MCS [phone]  

## 2019-09-29 ENCOUNTER — Other Ambulatory Visit: Payer: Self-pay

## 2019-09-29 ENCOUNTER — Ambulatory Visit (INDEPENDENT_AMBULATORY_CARE_PROVIDER_SITE_OTHER): Payer: Self-pay | Admitting: Pharmacist

## 2019-09-29 VITALS — BP 150/80 | HR 56

## 2019-09-29 DIAGNOSIS — I10 Essential (primary) hypertension: Secondary | ICD-10-CM

## 2019-09-29 MED ORDER — HYDRALAZINE HCL 25 MG PO TABS: 25.0000 mg | ORAL_TABLET | Freq: Three times a day (TID) | ORAL | 3 refills | Status: DC

## 2019-09-29 NOTE — Progress Notes (Signed)
Patient ID: Scott Richardson                 DOB: [birth year]                      MRN: [id number]     HPI: Scott Richardson is a 63 y.o. male referred by Dr. Radford Pax to HTN clinic. PMH is significant for HTN, HLD, stroke, ASCADwithcoronary CTAshowingoccluded RCA with distal collaterals and otherwise nonobstructive disease. He has a history of hypertensive DCM which resolved on aggressive medical therapy of his HTN. He recently had a sleep study done showing moderate OSA. Patient was seen on 08/19/2019 by Dr. Radford Pax where his blood pressure was 196/98 HR 62. At that visit patients amlodipine was increased to 10mg  daily and his metoprolol was changed to carvedilol 6.25mg  twice a day.  At last visit in the HTN clinic, patient had run out of HCTZ and had not taken any of his blood pressure medications prior to the appointment. Medication compliance/cost seemed to be an issue for patient as he does not have insurance. He was referred to social work for assistance with medicaid application and was given a blood pressure machine and a gift-card for [employer] to buy his medications.  Patient presents to HTN clinic for follow up. He denies dizziness, lightheadedness, headache, blurred vision, SOB or swelling. He just got his blood pressure cuff a few days ago. Michela Pitcher it got delivered to his neighbors house by mistake. He has not started his walking program yet. States he is going to start running in January. Patient states he has only missed 1 dose of medication since last visit.    Current HTN meds: amlodipine 10mg  daily, carvedilol 6.25mg  twice a day, lisinopril 40mg  daily, HCTZ 25mg  daily Previously tried: metoprolol (changed to carvedilol for better BP lowering) BP goal: <130/80  Family History: The patient's family history includes Blindness in his maternal aunt, maternal aunt, maternal aunt, and maternal aunt; Hypertension in his father and mother.  Social History: Neg ETOH, neg tobacco  Diet: 1 cup of coffee  per day, does not salt food, wife dose not cook with salt, sometimes eats TV dinner, [place] restaurants twice a week  Exercise: none  Home BP readings: 144/76  Wt Readings from Last 3 Encounters:  08/19/19 275 lb 1.9 oz (124.8 kg)  10/06/18 258 lb (117 kg)  07/19/18 266 lb 1.9 oz (120.7 kg)   BP Readings from Last 3 Encounters:  09/01/19 (!) 166/80  08/19/19 (!) 196/98  07/19/18 (!) 160/90   Pulse Readings from Last 3 Encounters:  09/01/19 (!) 59  08/19/19 62  07/19/18 (!) 55    Renal function: CrCl cannot be calculated (Patient's most recent lab result is older than the maximum 21 days allowed.).  Past Medical History:  Diagnosis Date  . CAD (coronary artery disease), native coronary artery    coronary CTA with occluded RCA with distal collaterals and otherwise nonobstructive disease.  Marland Kitchen HLD (hyperlipidemia)   . HTN (hypertension)   . LVH (left ventricular hypertrophy)   . Noncompliance   . Obesity   . Stroke [facility])     Current Outpatient Medications on File Prior to Visit  Medication Sig Dispense Refill  . amLODipine (NORVASC) 10 MG tablet Take 1 tablet (10 mg total) by mouth daily. 90 tablet 3  . aspirin EC 81 MG tablet Take 1 tablet (81 mg total) by mouth daily. 90 tablet 3  . atorvastatin (LIPITOR) 80 MG tablet Take 1  tablet (80 mg total) by mouth daily. 90 tablet 3  . carvedilol (COREG) 6.25 MG tablet Take 1 tablet (6.25 mg total) by mouth 2 (two) times daily. 180 tablet 3  . clopidogrel (PLAVIX) 75 MG tablet Take 1 tablet (75 mg total) by mouth daily. 90 tablet 3  . ezetimibe (ZETIA) 10 MG tablet TAKE 1 TABLET BY MOUTH ONCE DAILY. 90 tablet 3  . hydrochlorothiazide (HYDRODIURIL) 25 MG tablet Take 1 tablet (25 mg total) by mouth daily. 90 tablet 3  . Levothyroxine Sodium 88 MCG CAPS Take 1 capsule (88 mcg total) by mouth daily before breakfast. 30 capsule 0  . lisinopril (ZESTRIL) 40 MG tablet TAKE 1 TABLET BY MOUTH ONCE DAILY. PLEASE KEEP UPCOMING APPT IN OCTOBER  FOR FUTURE REFILLS. 90 tablet 1  . omeprazole (PRILOSEC) 40 MG capsule Take 40 mg by mouth daily.    . potassium chloride SA (KLOR-CON M20) 20 MEQ tablet Take 1 tablet (20 mEq total) by mouth daily. Please keep upcoming appt in October for future refills. Thank you 60 tablet 2  . sertraline (ZOLOFT) 50 MG tablet Take 1 tablet (50 mg total) by mouth daily. (Patient not taking: Reported on 09/01/2019) 90 tablet 3   No current facility-administered medications on file prior to visit.    No Known Allergies  There were no vitals taken for this visit.   Assessment/Plan:  1. Hypertension - Blood pressure in clinic today is above goal of <130/80. Do not have room to titrate carvedilol due to heart rate in the 50's. Will add hydralazine 25mg  three times a day. Patient instructed to take with AM meds (7AM) lunch 12PM and dinner (7-8PM). Continue amlodipine 10mg  daily, carvedilol 6.25mg  twice a day, lisinopril 40mg  daily and HCTZ 25mg  daily. Follow up via phone call in 3 weeks. Reviewed proper blood pressure checking technique with patient and instructed him to check his blood pressure in the afternoon.   Thank you  , Pharm.D, BCPS, CPP [facility]  [address], [place], [place]  Phone: [phone]; Fax: [phone]

## 2019-09-29 NOTE — Patient Instructions (Addendum)
It was a pleasure too see you again!  Please start taking hydralazine 25mg  three times a day with breakfast, lunch and dinner. Continue taking amlodipine 10mg  daily, carvedilol 6.25mg  twice a day, lisinopril 40mg  daily, HCTZ 25mg  daily  Call social worker, Raquel Sarna if you need help with your medicaid application. You can apply online at https://ncgov.[email or web address]/sp_beneficiary?id=bnf_apply Her phone number is [phone]  Check your blood pressure daily. Make sure you are seated and resting for 5 min before you check it Sit straight up and down and keep feet flat on the floor Do not talk while taking your blood pressure Avoid caffeine within 30 min of checking  We will follow up via telephone on 10/20/2019 at 9:00 AM  Call us at [phone] with any questions or concerns.

## 2019-10-19 NOTE — Progress Notes (Signed)
Patient ID: Scott Richardson                 DOB: 1956-02-13                      MRN: [id number]     HPI: Stanely Sexson is a 64 y.o. male referred by Dr. Mayford Knife to HTN clinic. PMH is significant for HTN, HLD, OSA, stroke, ASCADwithcoronary CTAshowingoccluded RCA with distal collaterals and otherwise nonobstructive disease. He has a history of hypertensive DCM which resolved on aggressive medical therapy of his HTN.  At last visit in HTN clinic, the patient's blood pressure was elevated at 150/80 mmHg. HR was in 50's so did not titrate carvedilol. Patient started taking hydralazine 25 mg three times a day. Patient instructed to take with AM meds (7AM) lunch 12PM and dinner (7-8PM). Medication compliance/cost seemed to be an issue for patient as he does not have insurance.  Patient meets with HTN clinic via phone call for follow-up. Reports he is doing well and has only missed one dose of hydralazine since prescribed. He denies dizziness, lightheadedness, headache. His wife states that the patient is becoming more forgetful and wants a referral to neurology. The patient and his wife report the patient has been adherent to taking medications and will start using a pill box. Completed a medication history with the patient's wife and it does appear patient is taking all of them. The patient does not check his BP daily, despite his wife's requests to do so. His wife also states that he is not exercising, but his still eating a healthy diet and drinking plenty of water. BP during phone call was 142/76 mmHg.12  Current HTN meds: amlodipine 10mg  daily, carvedilol 6.25mg  twice a day, lisinopril 40mg  daily, HCTZ 25mg  daily, hydralazine 25 mg three times a day  Previously tried: metoprolol (changed to carvedilol for better BP lowering) BP goal: <130/80 mmHg  Family History: The patient'sfamily history includes Blindness in his maternal aunt, maternal aunt, maternal aunt, and maternal aunt; Hypertension in his  father and mother.  Social History: Neg ETOH, neg tobacco  Diet: 1 cup of coffee per day, does not salt food, wife dose not cook with salt, sometimes eats TV dinner, restaurants twice a week  Exercise: None  Home BP readings: Over the past 2 weeks: 195/68, 170/80, 148/80, 136/80, 168/70  Wt Readings from Last 3 Encounters:  08/19/19 275 lb 1.9 oz (124.8 kg)  10/06/18 258 lb (117 kg)  07/19/18 266 lb 1.9 oz (120.7 kg)   BP Readings from Last 3 Encounters:  09/29/19 (!) 150/80  09/01/19 (!) 166/80  08/19/19 (!) 196/98   Pulse Readings from Last 3 Encounters:  09/29/19 (!) 56  09/01/19 (!) 59  08/19/19 62    Renal function: CrCl cannot be calculated (Patient's most recent lab result is older than the maximum 21 days allowed.).  Past Medical History:  Diagnosis Date  . CAD (coronary artery disease), native coronary artery    coronary CTA with occluded RCA with distal collaterals and otherwise nonobstructive disease.  10/01/19 HLD (hyperlipidemia)   . HTN (hypertension)   . LVH (left ventricular hypertrophy)   . Noncompliance   . Obesity   . Stroke [facility])     Current Outpatient Medications on File Prior to Visit  Medication Sig Dispense Refill  . amLODipine (NORVASC) 10 MG tablet Take 1 tablet (10 mg total) by mouth daily. 90 tablet 3  . aspirin EC 81 MG tablet  Take 1 tablet (81 mg total) by mouth daily. 90 tablet 3  . atorvastatin (LIPITOR) 80 MG tablet Take 1 tablet (80 mg total) by mouth daily. 90 tablet 3  . carvedilol (COREG) 6.25 MG tablet Take 1 tablet (6.25 mg total) by mouth 2 (two) times daily. 180 tablet 3  . clopidogrel (PLAVIX) 75 MG tablet Take 1 tablet (75 mg total) by mouth daily. 90 tablet 3  . ezetimibe (ZETIA) 10 MG tablet TAKE 1 TABLET BY MOUTH ONCE DAILY. 90 tablet 3  . hydrALAZINE (APRESOLINE) 25 MG tablet Take 1 tablet (25 mg total) by mouth 3 (three) times daily. 270 tablet 3  . hydrochlorothiazide (HYDRODIURIL) 25 MG tablet Take 1 tablet (25 mg  total) by mouth daily. 90 tablet 3  . Levothyroxine Sodium 88 MCG CAPS Take 1 capsule (88 mcg total) by mouth daily before breakfast. 30 capsule 0  . lisinopril (ZESTRIL) 40 MG tablet TAKE 1 TABLET BY MOUTH ONCE DAILY. PLEASE KEEP UPCOMING APPT IN OCTOBER FOR FUTURE REFILLS. 90 tablet 1  . omeprazole (PRILOSEC) 40 MG capsule Take 40 mg by mouth daily.    . potassium chloride SA (KLOR-CON M20) 20 MEQ tablet Take 1 tablet (20 mEq total) by mouth daily. Please keep upcoming appt in October for future refills. Thank you 60 tablet 2  . sertraline (ZOLOFT) 50 MG tablet Take 1 tablet (50 mg total) by mouth daily. (Patient not taking: Reported on 09/01/2019) 90 tablet 3   No current facility-administered medications on file prior to visit.    No Known Allergies   Assessment/Plan:  1. Hypertension - BP reamins above goal <130/80 mmHg. Increase hydralazine to 50 mg three times a day. Encouraged the patient to start exercising a few days a week and check his BP once a day 2 hours after taking morning medications or afternoon hydralazine dose. Informed patient's wife to contact PCP for a referral to neurology. Will follow-up with patient via phone call in 6 weeks. Patient instructions sent via mychart.  Julieta Bellini, PharmD Candidate  Ramond Dial, [place].D, BCPS, CPP [place]  [address], [place], [place] [address]  Phone: [phone]; Fax: [phone]

## 2019-10-20 ENCOUNTER — Other Ambulatory Visit: Payer: Self-pay

## 2019-10-20 ENCOUNTER — Telehealth (INDEPENDENT_AMBULATORY_CARE_PROVIDER_SITE_OTHER): Payer: Self-pay | Admitting: Pharmacist

## 2019-10-20 MED ORDER — HYDRALAZINE HCL 50 MG PO TABS: 50.0000 mg | ORAL_TABLET | Freq: Three times a day (TID) | ORAL | 3 refills | Status: DC

## 2019-10-20 NOTE — Patient Instructions (Addendum)
It was nice speaking with you today!  We will increase hydralazine to 50 mg three times a day. Start with taking 2 tablets of the 25 mg dose three times a day until you run out. Then, take 1 tablet of the 50 mg dose three times a day.  Continue taking amlodipine 10mg  once daily, carvedilol 6.25mg  twice a day, lisinopril 40mg  once daily, HCTZ 25mg  once daily  Please check your blood pressure once a day 2 hours after taking morning medications or 2 hours after the lunch hydralazine dose.  Please contact your PCP for a referral to neurology.  If you have any questions, please contact at [phone]. Thank you.

## 2019-11-14 NOTE — Progress Notes (Addendum)
Cardiology Office Note:    Date:  11/15/2019   ID:  Scott Richardson, DOB 05-May-1956, MRN [id number]  PCP:  Abigail Miyamoto, MD  Cardiologist:  Armanda Magic, MD    Referring MD: Abigail Miyamoto,*   Chief Complaint  Patient presents with  . Coronary Artery Disease  . Hypertension  . Cardiomyopathy  . Sleep Apnea  . Hyperlipidemia    History of Present Illness:    Scott Richardson is a 64 y.o. male with a hx of ASCADwithcoronary CTAshowingoccluded RCA with distal collaterals and otherwise nonobstructive disease.  He has a history of hypertensive DCM which resolved on aggressive medical therapy of his HTN. He recently had a sleep study done showing  moderate OSA with an AHI if 21.3/hr and nocturnal hypoxemia with 41% of the time spent with O2 sats < 85%with a minimum O2 sat of 76%.  He underwent CPAP titration to 14cm H2O.  He was seen in Nov 2020 and unfortunately he has no insurance and could not afford his device.   He is here today for followup and is doing well.  He denies any chest pain or pressure, SOB, DOE, PND, orthopnea, LE edema, dizziness, palpitations or syncope. He is compliant with his meds and is tolerating meds with no SE.  He is doing well with his CPAP device and thinks that he has gotten used to it.  He tolerates the mask and feels the pressure is adequate.  Since going on CPAP he feels rested in the am and has no significant daytime sleepiness.  He denies any significant mouth or nasal dryness or nasal congestion.  He does not think that he snores.     Past Medical History:  Diagnosis Date  . CAD (coronary artery disease), native coronary artery    coronary CTA with occluded RCA with distal collaterals and otherwise nonobstructive disease.  Marland Kitchen HLD (hyperlipidemia)   . HTN (hypertension)   . LVH (left ventricular hypertrophy)   . Noncompliance   . Obesity   . Stroke [facility])     Past Surgical History:  Procedure Laterality Date  . NO PAST SURGERIES     . TEE WITHOUT CARDIOVERSION N/A 12/01/2013   Procedure: TRANSESOPHAGEAL ECHOCARDIOGRAM (TEE);  Surgeon: Vesta Mixer, MD;  Location[facility] ENDOSCOPY;  Service: Cardiovascular;  Laterality: N/A;    Current Medications: Current Meds  Medication Sig  . amLODipine (NORVASC) 10 MG tablet Take 1 tablet (10 mg total) by mouth daily.  Marland Kitchen aspirin EC 81 MG tablet Take 1 tablet (81 mg total) by mouth daily.  Marland Kitchen atorvastatin (LIPITOR) 80 MG tablet Take 1 tablet (80 mg total) by mouth daily.  . carvedilol (COREG) 6.25 MG tablet Take 1 tablet (6.25 mg total) by mouth 2 (two) times daily.  . clopidogrel (PLAVIX) 75 MG tablet Take 1 tablet (75 mg total) by mouth daily.  Marland Kitchen ezetimibe (ZETIA) 10 MG tablet TAKE 1 TABLET BY MOUTH ONCE DAILY.  . hydrALAZINE (APRESOLINE) 50 MG tablet Take 1 tablet (50 mg total) by mouth 3 (three) times daily.  . hydrochlorothiazide (HYDRODIURIL) 25 MG tablet Take 1 tablet (25 mg total) by mouth daily.  . Levothyroxine Sodium 88 MCG CAPS Take 1 capsule (88 mcg total) by mouth daily before breakfast.  . lisinopril (ZESTRIL) 40 MG tablet TAKE 1 TABLET BY MOUTH ONCE DAILY.  Marland Kitchen omeprazole (PRILOSEC) 40 MG capsule Take 40 mg by mouth daily.  . potassium chloride SA (KLOR-CON M20) 20 MEQ tablet Take 1 tablet (20 mEq total)  by mouth daily.  . [DISCONTINUED] amLODipine (NORVASC) 10 MG tablet Take 1 tablet (10 mg total) by mouth daily.  . [DISCONTINUED] atorvastatin (LIPITOR) 80 MG tablet Take 1 tablet (80 mg total) by mouth daily.  . [DISCONTINUED] carvedilol (COREG) 6.25 MG tablet Take 1 tablet (6.25 mg total) by mouth 2 (two) times daily.  . [DISCONTINUED] clopidogrel (PLAVIX) 75 MG tablet Take 1 tablet (75 mg total) by mouth daily.  . [DISCONTINUED] ezetimibe (ZETIA) 10 MG tablet TAKE 1 TABLET BY MOUTH ONCE DAILY.  . [DISCONTINUED] hydrALAZINE (APRESOLINE) 50 MG tablet Take 1 tablet (50 mg total) by mouth 3 (three) times daily.  . [DISCONTINUED] hydrochlorothiazide (HYDRODIURIL) 25 MG  tablet Take 1 tablet (25 mg total) by mouth daily.  . [DISCONTINUED] lisinopril (ZESTRIL) 40 MG tablet TAKE 1 TABLET BY MOUTH ONCE DAILY. PLEASE KEEP UPCOMING APPT IN OCTOBER FOR FUTURE REFILLS.  . [DISCONTINUED] potassium chloride SA (KLOR-CON M20) 20 MEQ tablet Take 1 tablet (20 mEq total) by mouth daily. Please keep upcoming appt in October for future refills. Thank you     Allergies:   Patient has no known allergies.   Social History   Socioeconomic History  . Marital status: Married    Spouse name: Colleen Donahoe  . Number of children: 2  . Years of education: HS  . Highest education level: Not on file  Occupational History    Employer: OTHER    Comment: n/a  Tobacco Use  . Smoking status: Never Smoker  . Smokeless tobacco: Never Used  Substance and Sexual Activity  . Alcohol use: No    Alcohol/week: 0.0 standard drinks  . Drug use: No  . Sexual activity: Not Currently  Other Topics Concern  . Not on file  Social History Narrative   Patient is married with 2 children.   Patient is right handed.   Patient has hs education.   Patient drinks 1-2 cups daily.   Social Determinants of Health   Financial Resource Strain:   . Difficulty of Paying Living Expenses: Not on file  Food Insecurity:   . Worried About Charity fundraiser in the Last Year: Not on file  . Ran Out of Food in the Last Year: Not on file  Transportation Needs:   . Lack of Transportation (Medical): Not on file  . Lack of Transportation (Non-Medical): Not on file  Physical Activity:   . Days of Exercise per Week: Not on file  . Minutes of Exercise per Session: Not on file  Stress:   . Feeling of Stress : Not on file  Social Connections:   . Frequency of Communication with Friends and Family: Not on file  . Frequency of Social Gatherings with Friends and Family: Not on file  . Attends Religious Services: Not on file  . Active Member of Clubs or Organizations: Not on file  . Attends Theatre manager Meetings: Not on file  . Marital Status: Not on file     Family History: The patient's family history includes Blindness in his maternal aunt, maternal aunt, maternal aunt, and maternal aunt; Hypertension in his father and mother.  ROS:   Please see the history of present illness.    ROS  All other systems reviewed and negative.   EKGs/Labs/Other Studies Reviewed:    The following studies were reviewed today: Sleep study, CPAP titration and PAP compliance download from [facility]  EKG:  EKG is not ordered today.   Recent Labs: 09/01/2019: ALT 19; BUN 14;  Creatinine, Ser 1.00; Potassium 4.3; Sodium 137   Recent Lipid Panel    Component Value Date/Time   CHOL 205 (H) 09/01/2019 0957   TRIG 81 09/01/2019 0957   HDL 40 09/01/2019 0957   CHOLHDL 5.1 (H) 09/01/2019 0957   CHOLHDL 3.5 11/19/2015 0850   VLDL 15 11/19/2015 0850   LDLCALC 150 (H) 09/01/2019 0957    Physical Exam:    VS:  BP (!) 158/74   Pulse 74   Ht 5\' 8"  (1.727 m)   Wt 272 lb 9.6 oz (123.7 kg)   SpO2 95%   BMI 41.45 kg/m     Wt Readings from Last 3 Encounters:  11/15/19 272 lb 9.6 oz (123.7 kg)  08/19/19 275 lb 1.9 oz (124.8 kg)  10/06/18 258 lb (117 kg)     GEN:  Well nourished, well developed in no acute distress HEENT: Normal NECK: No JVD; No carotid bruits LYMPHATICS: No lymphadenopathy CARDIAC: RRR, no murmurs, rubs, gallops RESPIRATORY:  Clear to auscultation without rales, wheezing or rhonchi  ABDOMEN: Soft, non-tender, non-distended MUSCULOSKELETAL:  No edema; No deformity  SKIN: Warm and dry NEUROLOGIC:  Alert and oriented x 3 PSYCHIATRIC:  Normal affect   ASSESSMENT:    1. Coronary artery disease involving native coronary artery of native heart without angina pectoris   2. Essential hypertension   3. 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 OSA (obstructive sleep apnea)   5. Pure hypercholesterolemia   6. Hypertension, unspecified type   7.  Cerebrovascular accident (CVA), unspecified mechanism (HCC)   8. Noncompliance    PLAN:    In order of problems listed above:  1.  ASCAD -coronary CTAshowedoccluded RCA with distal collaterals and otherwise nonobstructive disease. -he denies any anginal sx -Continue ASA 81mg  daily, Plavix 75mg  daily, statin and BB  2.  HTN -BP borderline controlled on exam today but at home runs 120/70's -continue Amlodipine 10mg  daily, HCTZ 25mg  daily, Lisinopril 40mg  daily and Carvedilol 6.25mg  BID -Creatinine was stable on outside labs   3.  Hypertensive DCM -EF normalized on echo 2015 with EF 55-60%  4.  OSA -he was never able to get a device due to lack of insurance and has no interest in proceeding with it at this time.  5.  Hyperlipidemia -LDL goal < 70 -Last LDL was 150 in Dec 2020 but he had gotten his meds mixed up and had not been taking his statin -he is now back on atorva for past 3 weeks.  -repeat FLP and ALT in 5 weeks -continue Zetial 10mg  daily and atorvastatin 80mg  daily   Medication Adjustments/Labs and Tests Ordered: Current medicines are reviewed at length with the patient today.  Concerns regarding medicines are outlined above.  Orders Placed This Encounter  Procedures  . ALT  . Lipid panel   Meds ordered this encounter  Medications  . amLODipine (NORVASC) 10 MG tablet    Sig: Take 1 tablet (10 mg total) by mouth daily.    Dispense:  90 tablet    Refill:  3  . atorvastatin (LIPITOR) 80 MG tablet    Sig: Take 1 tablet (80 mg total) by mouth daily.    Dispense:  90 tablet    Refill:  3  . carvedilol (COREG) 6.25 MG tablet    Sig: Take 1 tablet (6.25 mg total) by mouth 2 (two) times daily.    Dispense:  180 tablet    Refill:  3  . clopidogrel (PLAVIX)  75 MG tablet    Sig: Take 1 tablet (75 mg total) by mouth daily.    Dispense:  90 tablet    Refill:  3  . ezetimibe (ZETIA) 10 MG tablet    Sig: TAKE 1 TABLET BY MOUTH ONCE DAILY.    Dispense:  90 tablet     Refill:  3  . hydrALAZINE (APRESOLINE) 50 MG tablet    Sig: Take 1 tablet (50 mg total) by mouth 3 (three) times daily.    Dispense:  270 tablet    Refill:  3  . hydrochlorothiazide (HYDRODIURIL) 25 MG tablet    Sig: Take 1 tablet (25 mg total) by mouth daily.    Dispense:  90 tablet    Refill:  3  . lisinopril (ZESTRIL) 40 MG tablet    Sig: TAKE 1 TABLET BY MOUTH ONCE DAILY.    Dispense:  90 tablet    Refill:  3  . potassium chloride SA (KLOR-CON M20) 20 MEQ tablet    Sig: Take 1 tablet (20 mEq total) by mouth daily.    Dispense:  90 tablet    Refill:  3    Signed, Armanda Magic, MD  11/15/2019 10:23 AM    [facility]

## 2019-11-15 ENCOUNTER — Ambulatory Visit (INDEPENDENT_AMBULATORY_CARE_PROVIDER_SITE_OTHER): Payer: Self-pay | Admitting: Cardiology

## 2019-11-15 ENCOUNTER — Other Ambulatory Visit: Payer: Self-pay

## 2019-11-15 VITALS — BP 158/74 | HR 74 | Ht 68.0 in | Wt 272.6 lb

## 2019-11-15 DIAGNOSIS — G4733 Obstructive sleep apnea (adult) (pediatric): Secondary | ICD-10-CM

## 2019-11-15 DIAGNOSIS — I10 Essential (primary) hypertension: Secondary | ICD-10-CM

## 2019-11-15 DIAGNOSIS — E7800 Pure hypercholesterolemia, unspecified: Secondary | ICD-10-CM

## 2019-11-15 DIAGNOSIS — I639 Cerebral infarction, unspecified: Secondary | ICD-10-CM

## 2019-11-15 DIAGNOSIS — I43 Cardiomyopathy in diseases classified elsewhere: Secondary | ICD-10-CM

## 2019-11-15 DIAGNOSIS — Z9119 Patient's noncompliance with other medical treatment and regimen: Secondary | ICD-10-CM

## 2019-11-15 DIAGNOSIS — I2510 Atherosclerotic heart disease of native coronary artery without angina pectoris: Secondary | ICD-10-CM

## 2019-11-15 DIAGNOSIS — Z91199 Patient's noncompliance with other medical treatment and regimen due to unspecified reason: Secondary | ICD-10-CM

## 2019-11-15 DIAGNOSIS — I119 Hypertensive heart disease without heart failure: Secondary | ICD-10-CM

## 2019-11-15 MED ORDER — HYDROCHLOROTHIAZIDE 25 MG PO TABS: 25.0000 mg | ORAL_TABLET | Freq: Every day | ORAL | 3 refills | Status: DC

## 2019-11-15 MED ORDER — POTASSIUM CHLORIDE CRYS ER 20 MEQ PO TBCR: 20.0000 meq | EXTENDED_RELEASE_TABLET | Freq: Every day | ORAL | 3 refills | Status: DC

## 2019-11-15 MED ORDER — ATORVASTATIN CALCIUM 80 MG PO TABS: 80.0000 mg | ORAL_TABLET | Freq: Every day | ORAL | 3 refills | Status: DC

## 2019-11-15 MED ORDER — CARVEDILOL 6.25 MG PO TABS: 6.2500 mg | ORAL_TABLET | Freq: Two times a day (BID) | ORAL | 3 refills | Status: DC

## 2019-11-15 MED ORDER — HYDRALAZINE HCL 50 MG PO TABS: 50.0000 mg | ORAL_TABLET | Freq: Three times a day (TID) | ORAL | 3 refills | Status: DC

## 2019-11-15 MED ORDER — CLOPIDOGREL BISULFATE 75 MG PO TABS: 75.0000 mg | ORAL_TABLET | Freq: Every day | ORAL | 3 refills | Status: DC

## 2019-11-15 MED ORDER — LISINOPRIL 40 MG PO TABS: ORAL_TABLET | ORAL | 3 refills | Status: DC

## 2019-11-15 MED ORDER — EZETIMIBE 10 MG PO TABS: ORAL_TABLET | ORAL | 3 refills | Status: DC

## 2019-11-15 MED ORDER — AMLODIPINE BESYLATE 10 MG PO TABS: 10.0000 mg | ORAL_TABLET | Freq: Every day | ORAL | 3 refills | Status: DC

## 2019-11-15 NOTE — Patient Instructions (Addendum)
Medication Instructions:  Your physician recommends that you continue on your current medications as directed. Please refer to the Current Medication list given to you today.  *If you need a refill on your cardiac medications before your next appointment, please call your pharmacy*  Lab Work: Fasting lipid panel and ALT on March 23rd, 2021. Please fast for 8 hours prior.  If you have labs (blood work) drawn today and your tests are completely normal, you will receive your results only by: Marland Kitchen MyChart Message (if you have MyChart) OR . A paper copy in the mail If you have any lab test that is abnormal or we need to change your treatment, we will call you to review the results.  Follow-Up: At [facility], you and your health needs are our priority.  As part of our continuing mission to provide you with exceptional heart care, we have created designated Provider Care Teams.  These Care Teams include your primary Cardiologist (physician) and Advanced Practice Providers (APPs -  Physician Assistants and Nurse Practitioners) who all work together to provide you with the care you need, when you need it.  Your next appointment:   1 year(s)  The format for your next appointment:   In Person  Provider:   Armanda Magic, MD

## 2019-11-28 NOTE — Progress Notes (Signed)
Patient ID: Ulys Favia                 DOB: 1956-08-05                      MRN: [id number]     HPI: Scott Richardson is a 64 y.o. male referred by Dr. Mayford Knife to HTN clinic. PMH is significant for HTN, HLD, OSA, stroke, ASCADwithcoronary CTAshowingoccluded RCA with distal collaterals and otherwise nonobstructive disease. He has a history of hypertensive DCM which resolved on aggressive medical therapy of his HTN. Recent sleep study showed moderate OSA, but patient was not able to obtain a CPAP.  At recent visit to hypertension clinic in Jan 2020, hydralazine was increased to 50mg  TID. At his last visit, his BP was 158/74 but patient reported an average home reading of 120/70's so no changes were made to his meds at that time.  Patient was contacted over the phone today for follow-up of his hypertension. Patient's wife reported Scott Richardson was not taking his blood pressure since starting his new medication (hydralazine) but patient reported his blood pressure over the last week averaged 70-90/25. Scott Richardson was reminded of his goal of <130/80 but that his reported blood pressure was concerning. He stated he was not actually sure what his blood pressure was. Patient denied symptoms of dizziness or lightheadedness and was encouraged to start writing down his BP readings in a log to aid him in remembering. Patient reports missing 3 doses of his medications over the past week. Patient stated he forgot to get a pill box since his last visit but stated he would ask his wife to pick one up. Scott Richardson has had great success with diet modifications including cutting back on portions and eating boiled chicken. He reports he has lost 10lbs over the last month. Patient is uninterested in a neurology referral at this time and states his memory is fine. Scott Richardson has not yet applied for Medicaid but stated he is working on it.   Current HTN meds: amlodipine 10mg  daily, carvedilol 6.25mg  twice a day, lisinopril 40mg  daily,  HCTZ 25mg  daily, hydralazine 50 mg three times a day  Previously tried: metoprolol (changed to carvedilol for better BP lowering) BP goal: <130/80 mmHg  Family History: The patient'sfamily history includes Blindness in his maternal aunt, maternal aunt, maternal aunt, and maternal aunt; Hypertension in his father and mother.  Social History: Neg ETOH, neg tobacco  Diet: 1 cup of coffee per day, does not salt food, wife dose not cook with salt, sometimes eats TV dinner, [name] restaurants twice a week; cutting down on portions and eating broiled chicken  Exercise: None, encouraged to walk a few times a week  Home BP readings: patient reported 70-90/25 but wife reported he was not taking his BP at home  Wt Readings from Last 3 Encounters:  11/15/19 272 lb 9.6 oz (123.7 kg)  08/19/19 275 lb 1.9 oz (124.8 kg)  10/06/18 258 lb (117 kg)   BP Readings from Last 3 Encounters:  11/15/19 (!) 158/74  09/29/19 (!) 150/80  09/01/19 (!) 166/80   Pulse Readings from Last 3 Encounters:  11/15/19 74  09/29/19 (!) 56  09/01/19 (!) 59    Renal function: CrCl cannot be calculated (Patient's most recent lab result is older than the maximum 21 days allowed.).  Past Medical History:  Diagnosis Date  . CAD (coronary artery disease), native coronary artery    coronary CTA with occluded RCA  with distal collaterals and otherwise nonobstructive disease.  Marland Kitchen HLD (hyperlipidemia)   . HTN (hypertension)   . LVH (left ventricular hypertrophy)   . Noncompliance   . Obesity   . Stroke [facility])     Current Outpatient Medications on File Prior to Visit  Medication Sig Dispense Refill  . amLODipine (NORVASC) 10 MG tablet Take 1 tablet (10 mg total) by mouth daily. 90 tablet 3  . aspirin EC 81 MG tablet Take 1 tablet (81 mg total) by mouth daily. 90 tablet 3  . atorvastatin (LIPITOR) 80 MG tablet Take 1 tablet (80 mg total) by mouth daily. 90 tablet 3  . carvedilol (COREG) 6.25 MG tablet Take 1 tablet  (6.25 mg total) by mouth 2 (two) times daily. 180 tablet 3  . clopidogrel (PLAVIX) 75 MG tablet Take 1 tablet (75 mg total) by mouth daily. 90 tablet 3  . ezetimibe (ZETIA) 10 MG tablet TAKE 1 TABLET BY MOUTH ONCE DAILY. 90 tablet 3  . hydrALAZINE (APRESOLINE) 50 MG tablet Take 1 tablet (50 mg total) by mouth 3 (three) times daily. 270 tablet 3  . hydrochlorothiazide (HYDRODIURIL) 25 MG tablet Take 1 tablet (25 mg total) by mouth daily. 90 tablet 3  . Levothyroxine Sodium 88 MCG CAPS Take 1 capsule (88 mcg total) by mouth daily before breakfast. 30 capsule 0  . lisinopril (ZESTRIL) 40 MG tablet TAKE 1 TABLET BY MOUTH ONCE DAILY. 90 tablet 3  . omeprazole (PRILOSEC) 40 MG capsule Take 40 mg by mouth daily.    . potassium chloride SA (KLOR-CON M20) 20 MEQ tablet Take 1 tablet (20 mEq total) by mouth daily. 90 tablet 3   No current facility-administered medications on file prior to visit.    No Known Allergies   Assessment/Plan:  1. Hypertension Based on BP goal of <130/80, we are unable to determine if patient is at goal since he has not routinely been monitoring his BP at home. Due to nature of telephone visit, asked patient to come in person to clinic so we can properly assess his blood pressure. Patient has a lab visit scheduled for March 23 so we will schedule his appointment for 9am that day. Encouraged patient to take blood pressure once daily at home and bring in his recordings to his appointment. No changes to medications at this time but will make necessary changes to medications based on BP readings at next visit. He will continue on amlodipine 10mg  daily, carvedilol 6.25mg  twice a day, lisinopril 40mg  daily, HCTZ 25mg  daily, and hydralazine 50 mg three times a day until next appt.  Ladoris Gene, PharmD Candidate  Megan E. Supple, PharmD, BCACP, [place] [address] [place], [place] [address] Phone: [phone]; Fax: [phone] 11/30/2019 10:18  AM

## 2019-11-30 ENCOUNTER — Other Ambulatory Visit: Payer: Self-pay

## 2019-11-30 ENCOUNTER — Telehealth (INDEPENDENT_AMBULATORY_CARE_PROVIDER_SITE_OTHER): Payer: Self-pay | Admitting: Pharmacist

## 2019-11-30 DIAGNOSIS — I10 Essential (primary) hypertension: Secondary | ICD-10-CM

## 2019-12-19 NOTE — Progress Notes (Signed)
Patient ID: [name]                 DOB: [birth year]                      MRN: [id number]     HPI: Scott Richardson is a 64 y.o. male referred by Dr. Mayford Knife to HTN clinic. PMH is significant for HTN, HLD, OSA, stroke, ASCADwithcoronary CTAshowingoccluded RCA with distal collaterals and otherwise nonobstructive disease. He has a history of hypertensive DCM which resolved on aggressive medical therapy of his HTN. Recent sleep study showed moderate OSA, but patient was not able to obtain a CPAP.   At last visit, patient was contacted over the phone for follow-up of his hypertension. Patient's wife reported Scott Richardson was not taking his blood pressure since starting his new medication (hydralazine) but patient reported his blood pressure over the last week averaged 70-90/25. Scott Richardson was reminded of his goal of <130/80 but that his reported blood pressure was concerning. He stated he was not actually sure what his blood pressure was. Patient denied symptoms of dizziness or lightheadedness and was encouraged to start writing down his BP readings in a log to aid him in remembering. Patient reports missing 3 doses of his medications over the past week. Patient stated he forgot to get a pill box since his last visit but stated he would ask his wife to pick one up. Scott Richardson has had great success with diet modifications including cutting back on portions and eating boiled chicken. He reported he had lost 10lbs over the last month. Patient is uninterested in a neurology referral at this time and states his memory is fine. Scott Richardson had not yet applied for Medicaid but stated he was working on it.   Patient presents to clinic today for follow up of his blood pressure in person as well as a lipid panel and LFT labs. Lipid panel 08/2019: TC 205, TG 81, HDL 40, LDL 150. At that time patient had run out of refills on atorvastatin 80mg , Zetia 10mg  daily and refills were sent in. Patient to repeat lipid panel today. Last  CMET Dec 2020 was normal. Patient was unsure if he had started his hydralazine 50mg  three times daily but did state he was taking medication at lunch time. Patient denies issues affording his medication. Scott Richardson endorses continuing his low sodium diet with goals to lose more weight. He endorsed wanting to exercise more and that he will soon begin doing more yard work and may pursue a Jan 2021. Patient is taking his blood pressure on his bicep cuff at home with readings averaging 140's/60-70's. He is measuring it in the evenings but is not resting prior to taking it. BP in clinic today was 155/48mmHg.  Current HTN meds (still need to confirm with patient): amlodipine 10mg  daily, carvedilol 6.25mg  twice a day, lisinopril 40mg  daily, HCTZ 25mg  daily, hydralazine 50 mg three times a day  Previously tried: metoprolol (changed to carvedilol for better BP lowering) BP goal: <130/80 mmHg  LDL goal: <55 Risk Factors: history of stroke, coronary CTA, LDL 150   Family History: The patient'sfamily history includes Blindness in his maternal aunt, maternal aunt, maternal aunt, and maternal aunt; Hypertension in his father and mother.  Social History: Neg ETOH, neg tobacco  Diet: 1 cup of coffee per day, does not salt food, wife dose not cook with salt, sometimes eats TV dinner, [name] restaurants twice a week; cutting down on  portions and eating broiled chicken  Exercise: None but planning to start doing more yard work  Home BP readings: averages 140's/60-70's (is not resting prior to taking in the evening)  Labs: Lipid panel 08/2019: TC 205, TG 81, HDL 40, LDL 150 (was not taking anything - had run out of refills on atorvastatin 80mg  daily, Zetia 10mg  daily)  Wt Readings from Last 3 Encounters:  11/15/19 272 lb 9.6 oz (123.7 kg)  08/19/19 275 lb 1.9 oz (124.8 kg)  10/06/18 258 lb (117 kg)   BP Readings from Last 3 Encounters:  11/15/19 (!) 158/74  09/29/19 (!) 150/80  09/01/19 (!) 166/80     Pulse Readings from Last 3 Encounters:  11/15/19 74  09/29/19 (!) 56  09/01/19 (!) 59    Renal function: CrCl cannot be calculated (Patient's most recent lab result is older than the maximum 21 days allowed.).  Past Medical History:  Diagnosis Date  . CAD (coronary artery disease), native coronary artery    coronary CTA with occluded RCA with distal collaterals and otherwise nonobstructive disease.  Marland Kitchen HLD (hyperlipidemia)   . HTN (hypertension)   . LVH (left ventricular hypertrophy)   . Noncompliance   . Obesity   . Stroke [facility])     Current Outpatient Medications on File Prior to Visit  Medication Sig Dispense Refill  . amLODipine (NORVASC) 10 MG tablet Take 1 tablet (10 mg total) by mouth daily. 90 tablet 3  . aspirin EC 81 MG tablet Take 1 tablet (81 mg total) by mouth daily. 90 tablet 3  . atorvastatin (LIPITOR) 80 MG tablet Take 1 tablet (80 mg total) by mouth daily. 90 tablet 3  . carvedilol (COREG) 6.25 MG tablet Take 1 tablet (6.25 mg total) by mouth 2 (two) times daily. 180 tablet 3  . clopidogrel (PLAVIX) 75 MG tablet Take 1 tablet (75 mg total) by mouth daily. 90 tablet 3  . ezetimibe (ZETIA) 10 MG tablet TAKE 1 TABLET BY MOUTH ONCE DAILY. 90 tablet 3  . hydrALAZINE (APRESOLINE) 50 MG tablet Take 1 tablet (50 mg total) by mouth 3 (three) times daily. 270 tablet 3  . hydrochlorothiazide (HYDRODIURIL) 25 MG tablet Take 1 tablet (25 mg total) by mouth daily. 90 tablet 3  . Levothyroxine Sodium 88 MCG CAPS Take 1 capsule (88 mcg total) by mouth daily before breakfast. 30 capsule 0  . lisinopril (ZESTRIL) 40 MG tablet TAKE 1 TABLET BY MOUTH ONCE DAILY. 90 tablet 3  . omeprazole (PRILOSEC) 40 MG capsule Take 40 mg by mouth daily.    . potassium chloride SA (KLOR-CON M20) 20 MEQ tablet Take 1 tablet (20 mEq total) by mouth daily. 90 tablet 3   No current facility-administered medications on file prior to visit.    No Known Allergies   Assessment/Plan:  1.  Hypertension - Based on BP goal <130/80, patient is still above goal. Because patient was not able to confirm his current medications with confidence, we will give him a call at home on Thursday at 10am when he can look at his medication bottles. We will determine any medication changes at that time and schedule follow up accordingly. Patient was encouraged to continue taking his blood pressure at home but to take it 1-2 hours after his medications and to rest for at least 5 minutes. Patient was encouraged to bring his blood pressure cuff in to his next appointment as well as his home readings.  2. Hyperlipidemia - Based on LDL goal <55, patient  was above goal without medications. Patient had previously been closer to goal on atorvastatin 80mg  and Zetia 10mg  daily but had run out of refills. Patient had lipid panel and ALT drawn today after starting back on this regimen. We will follow up with these results when we call patient on Thursday.   , PharmD Candidate  Sunday, Pharm.D, BCPS, CPP [facility]  [address] [place], [address] [place]  Phone: [phone]; Fax: [phone]

## 2019-12-20 ENCOUNTER — Ambulatory Visit (INDEPENDENT_AMBULATORY_CARE_PROVIDER_SITE_OTHER): Payer: Self-pay | Admitting: Pharmacist

## 2019-12-20 ENCOUNTER — Other Ambulatory Visit: Payer: Self-pay

## 2019-12-20 VITALS — BP 155/80 | HR 62

## 2019-12-20 DIAGNOSIS — G4733 Obstructive sleep apnea (adult) (pediatric): Secondary | ICD-10-CM

## 2019-12-20 DIAGNOSIS — E7800 Pure hypercholesterolemia, unspecified: Secondary | ICD-10-CM

## 2019-12-20 DIAGNOSIS — I2510 Atherosclerotic heart disease of native coronary artery without angina pectoris: Secondary | ICD-10-CM

## 2019-12-20 DIAGNOSIS — I639 Cerebral infarction, unspecified: Secondary | ICD-10-CM

## 2019-12-20 DIAGNOSIS — I10 Essential (primary) hypertension: Secondary | ICD-10-CM

## 2019-12-20 DIAGNOSIS — I119 Hypertensive heart disease without heart failure: Secondary | ICD-10-CM

## 2019-12-20 DIAGNOSIS — Z9119 Patient's noncompliance with other medical treatment and regimen: Secondary | ICD-10-CM

## 2019-12-20 DIAGNOSIS — Z91199 Patient's noncompliance with other medical treatment and regimen due to unspecified reason: Secondary | ICD-10-CM

## 2019-12-20 LAB — LIPID PANEL
Chol/HDL Ratio: 3.5 ratio (ref 0.0–5.0)
Cholesterol, Total: 111 mg/dL (ref 100–199)
HDL: 32 mg/dL — ABNORMAL LOW (ref >39)
LDL Chol Calc (NIH): 62 mg/dL (ref 0–99)
Triglycerides: 89 mg/dL (ref 0–149)
VLDL Cholesterol Cal: 17 mg/dL (ref 5–40)

## 2019-12-20 LAB — ALT: ALT: 38 IU/L (ref 0–44)

## 2019-12-20 NOTE — Patient Instructions (Addendum)
It was great seeing you today!  Your blood pressure is still above your goal of <130/80. Continue your medications as you're taking them at home. I will give you a call Thursday at 10am to verify the exact medications and doses you are currently taking and we will make a plan from there.  Try to increase your exercise as you can. Our goal is 150 minutes of activity that raises our heart rate per week.   Take your blood pressure daily and write down your readings. Remember to take it 1-2 hours after your medications and after you've been resting for 5 minutes.

## 2019-12-22 ENCOUNTER — Telehealth: Payer: Self-pay

## 2019-12-22 NOTE — Telephone Encounter (Signed)
Reached out to Scott Richardson over the phone regarding his recent lab work and to review the medications he is taking at home. Lipid panel has greatly improved after restarting atorvastatin 80mg  daily and Zetia 10mg  daily (LDL 62, HD 32, TG 89) and his ALT are WNL. Because it has only been 4 weeks since starting these, he may continue to see LDL lowering benefit to reach his LDL goal <55.   Patient had his bottles at home and read off of each to confirm what medications he is taking: - amlodipine 10mg  daily - carvedilol 6.25mg  twice a day - HCTZ 25mg  daily - hydralazine 50 mg three times a day - KCl daily Patient reported he had stopped taking lisinopril 40mg  daily 6-5mo ago because his doctor had told him to stop. Scott Richardson was unsure which doctor it was but thought it may be his PCP. Patient denied ever experiencing cough or symptoms of angioedema such as swelling in the face and throat or shortness of breath. A thorough review of labs within the last year are all WNL.   Patient will resume taking lisinopril 40mg  daily and we will take BMET and follow up in clinic in 2 weeks. Patient was encouraged to bring in his BP cuff at that time.

## 2020-01-05 ENCOUNTER — Ambulatory Visit (INDEPENDENT_AMBULATORY_CARE_PROVIDER_SITE_OTHER): Payer: Self-pay | Admitting: Pharmacist

## 2020-01-05 ENCOUNTER — Encounter: Payer: Self-pay | Admitting: Pharmacist

## 2020-01-05 ENCOUNTER — Other Ambulatory Visit: Payer: Self-pay

## 2020-01-05 VITALS — BP 128/66 | HR 49

## 2020-01-05 DIAGNOSIS — I10 Essential (primary) hypertension: Secondary | ICD-10-CM

## 2020-01-05 LAB — BASIC METABOLIC PANEL
BUN/Creatinine Ratio: 12 (ref 10–24)
BUN: 11 mg/dL (ref 8–27)
CO2: 20 mmol/L (ref 20–29)
Calcium: 9 mg/dL (ref 8.6–10.2)
Chloride: 103 mmol/L (ref 96–106)
Creatinine, Ser: 0.9 mg/dL (ref 0.76–1.27)
GFR calc Af Amer: 105 mL/min/{1.73_m2} (ref >59)
GFR calc non Af Amer: 91 mL/min/{1.73_m2} (ref >59)
Glucose: 171 mg/dL — ABNORMAL HIGH (ref 65–99)
Potassium: 4.2 mmol/L (ref 3.5–5.2)
Sodium: 137 mmol/L (ref 134–144)

## 2020-01-05 MED ORDER — CARVEDILOL 3.125 MG PO TABS: 3.1250 mg | ORAL_TABLET | Freq: Two times a day (BID) | ORAL | 3 refills | Status: DC

## 2020-01-05 NOTE — Progress Notes (Signed)
Patient ID: Scott Richardson                 DOB: 1956/03/13                      MRN: [id number]     HPI: Scott Richardson is a 64 y.o. male referred by Dr. Radford Pax to HTN clinic. PMH is significant for HTN, HLD, OSA, stroke, ASCADwithcoronary CTAshowingoccluded RCA with distal collaterals and otherwise nonobstructive disease. He has a history of hypertensive DCM which resolved on aggressive medical therapy of his HTN.   Patient last seen in the HTN clinic on 12/20/19. His blood pressure was elevated at 155/80. Patient was unsure if he was taking hydralazine. A med rec was done a few days after the visit via telephone and it appears patient was taking hydralazine, but was not taking lisinopril. Patient was asked to resume lisinopril. Patient is uninterested in a neurology referral at this time and states his memory is fine. Scott Richardson had not yet applied for Medicaid but stated he was working on it. LDL had improved greatly on atorvastatin 80mg  and zetia 10mg  daily.  Patient presents today for follow up. He will need a BMP today since resuming lisinopril. He denies dizziness, lightheadedness, headache, blurred vision, SOB or swelling. Reports increased fatigue with resuming lisinopril, but feels better now. He states that he sometimes has problems affording his medications, but he "makes it work."  He states he has not increased his exercise like he should. Patient brought his home cuff with him today. Meter appears accurate.  HR was in the 40's today and on a few occasions at home.  128/69 home cuff 128/66 office cuff   Current HTN meds  amlodipine 10mg  daily, carvedilol 6.25mg  twice a day, lisinopril 40mg  daily, HCTZ 25mg  daily, hydralazine 50 mg three times a day (7:30, 12:30, 11PM)  Previously tried: metoprolol (changed to carvedilol for better BP lowering) BP goal: <130/80 mmHg  LDL goal: <55 Risk Factors: history of stroke, coronary CTA, LDL 150   Family History: The patient'sfamily history  includes Blindness in his maternal aunt, maternal aunt, maternal aunt, and maternal aunt; Hypertension in his father and mother.  Social History: Neg ETOH, neg tobacco  Diet: 1 cup of coffee per day, does not salt food, wife dose not cook with salt, sometimes eats TV dinner, [place] restaurants twice a week; cutting down on portions and eating broiled chicken  Exercise: None but planning to start doing more yard work  Home BP readings: 138/58, 160/58 (AM before meds)  Labs: Lipid panel 12/20/19: TC 111, TG 89, HDL 32, LDL 62  (atorvastatin 80mg  daily, Zetia 10mg  daily)  Wt Readings from Last 3 Encounters:  11/15/19 272 lb 9.6 oz (123.7 kg)  08/19/19 275 lb 1.9 oz (124.8 kg)  10/06/18 258 lb (117 kg)   BP Readings from Last 3 Encounters:  12/20/19 (!) 155/80  11/15/19 (!) 158/74  09/29/19 (!) 150/80   Pulse Readings from Last 3 Encounters:  12/20/19 62  11/15/19 74  09/29/19 (!) 56    Renal function: CrCl cannot be calculated (Patient's most recent lab result is older than the maximum 21 days allowed.).  Past Medical History:  Diagnosis Date  . CAD (coronary artery disease), native coronary artery    coronary CTA with occluded RCA with distal collaterals and otherwise nonobstructive disease.  Marland Kitchen HLD (hyperlipidemia)   . HTN (hypertension)   . LVH (left ventricular hypertrophy)   . Noncompliance   .  Obesity   . Stroke [facility])     Current Outpatient Medications on File Prior to Visit  Medication Sig Dispense Refill  . amLODipine (NORVASC) 10 MG tablet Take 1 tablet (10 mg total) by mouth daily. 90 tablet 3  . aspirin EC 81 MG tablet Take 1 tablet (81 mg total) by mouth daily. 90 tablet 3  . atorvastatin (LIPITOR) 80 MG tablet Take 1 tablet (80 mg total) by mouth daily. 90 tablet 3  . carvedilol (COREG) 6.25 MG tablet Take 1 tablet (6.25 mg total) by mouth 2 (two) times daily. 180 tablet 3  . clopidogrel (PLAVIX) 75 MG tablet Take 1 tablet (75 mg total) by mouth daily. 90  tablet 3  . ezetimibe (ZETIA) 10 MG tablet TAKE 1 TABLET BY MOUTH ONCE DAILY. 90 tablet 3  . hydrALAZINE (APRESOLINE) 50 MG tablet Take 1 tablet (50 mg total) by mouth 3 (three) times daily. 270 tablet 3  . hydrochlorothiazide (HYDRODIURIL) 25 MG tablet Take 1 tablet (25 mg total) by mouth daily. 90 tablet 3  . Levothyroxine Sodium 88 MCG CAPS Take 1 capsule (88 mcg total) by mouth daily before breakfast. 30 capsule 0  . lisinopril (ZESTRIL) 40 MG tablet TAKE 1 TABLET BY MOUTH ONCE DAILY. 90 tablet 3  . omeprazole (PRILOSEC) 40 MG capsule Take 40 mg by mouth daily.    . potassium chloride SA (KLOR-CON M20) 20 MEQ tablet Take 1 tablet (20 mEq total) by mouth daily. 90 tablet 3   No current facility-administered medications on file prior to visit.    No Known Allergies   Assessment/Plan:  1. Hypertension - Blood pressure today in clinic is at goal of <130/80. HR was in the 40's today. Patient reports fatigue. I will decrease his carvedilol to 3.125mg  twice a day. Continue amlodipine 10mg  daily, lisinopril 40mg  daily, HCTZ 25mg  daily, hydralazine 50 mg three times a day. I will follow up on BMP results drawn today. I have asked the patient to check his blood pressure once a day at least 2 hours after his medications and record both his blood pressure and heart rate. I reviewed where to find this information on his machine and proper blood pressure monitoring technique. Follow up via telephone in 2 weeks. I have also provided patient with social works phone number for any insurance/medication assistance needs. Also offered patient giftcard to [facility] for medication copays if he needs. He declined at this time.   , Pharm.D, BCPS, CPP [facility]  [address], [place], [place] [address]  Phone: [phone]; Fax: [phone]

## 2020-01-05 NOTE — Patient Instructions (Addendum)
Please start taking carvedilol 3.125mg  twice a day. This is a dose DECREASE. You can take ONE-HALF tablet of the 6.25mg  tablets you have at home. But I also called in a new prescription for the lower dose 3.125mg  tablets which you will take ONE of.  Continue  amlodipine 10mg  daily, lisinopril 40mg  daily, HCTZ 25mg  daily, hydralazine 50 mg three times a day.  Social worker- , LCSW, CCSW-MCS [phone] Call her if you need help with medicaid application, or assistance with medication costs.  Call at [phone] with any questions or concerns  Please record your blood pressure and heart rate daily for the next two weeks.   Before checking your blood pressure make sure: You are seated and quite for 5 min before checking Feet are flat on the floor Siting in chair with your back supported straight up and down Arm resting on table or arm of chair at heart level Bladder is empty You have NOT had caffeine or tobacco within the last 30 min  Check your blood pressure 2-3 times about 1-2 min apart. Usually the first reading will be the highest. Record these readings.  Only check your blood pressure once a day, unless otherwise directed Record your blood pressure readings and bring them to all your appointments. If your meter stores your readings in its memory, then you may bring your blood pressure meter with you to your appointments.   Lifestyle changes can make a world of difference and are even more important than medications: Try to keep your sodium intake to 2300 mg of sodium per day Get 6-8 uninterrupted hours of sleep per night Aim for a goal of 150 min of moderate aerobic exercise (ie brisk walking, bike riding) per week

## 2020-01-06 ENCOUNTER — Telehealth: Payer: Self-pay | Admitting: Pharmacist

## 2020-01-06 NOTE — Telephone Encounter (Signed)
Spoke with patient, informed him his labs were stable. He should continue medications as discussed yesterday at appointment.

## 2020-01-18 ENCOUNTER — Ambulatory Visit: Payer: Self-pay | Admitting: Legal Medicine

## 2020-01-18 ENCOUNTER — Encounter: Payer: Self-pay | Admitting: Legal Medicine

## 2020-01-18 DIAGNOSIS — K219 Gastro-esophageal reflux disease without esophagitis: Secondary | ICD-10-CM | POA: Insufficient documentation

## 2020-01-18 DIAGNOSIS — R7303 Prediabetes: Secondary | ICD-10-CM | POA: Insufficient documentation

## 2020-01-18 DIAGNOSIS — E782 Mixed hyperlipidemia: Secondary | ICD-10-CM | POA: Insufficient documentation

## 2020-01-18 DIAGNOSIS — E038 Other specified hypothyroidism: Secondary | ICD-10-CM | POA: Insufficient documentation

## 2020-01-19 ENCOUNTER — Other Ambulatory Visit: Payer: Self-pay

## 2020-01-19 ENCOUNTER — Telehealth (INDEPENDENT_AMBULATORY_CARE_PROVIDER_SITE_OTHER): Payer: Self-pay | Admitting: Pharmacist

## 2020-01-19 VITALS — BP 139/67 | HR 43

## 2020-01-19 DIAGNOSIS — I10 Essential (primary) hypertension: Secondary | ICD-10-CM

## 2020-01-19 NOTE — Progress Notes (Signed)
Patient ID: Scott Richardson                 DOB: 08-27-1956                      MRN: [id number]     HPI: Scott Richardson is a 64 y.o. male referred by Dr. Mayford Knife to HTN clinic. PMH is significant for HTN, HLD, OSA, stroke, ASCADwithcoronary CTAshowingoccluded RCA with distal collaterals and otherwise nonobstructive disease. He has a history of hypertensive DCM which resolved on aggressive medical therapy of his HTN.   Patient last seen in the HTN clinic on 01/05/20. His blood pressure was at goal. However patient was bradycardic with HR in the 40's and complained of fatigue. Therefore carvedilol was decreased to 3.125mg  twice a day.  His home blood pressure cuff was previously compared to clinic cuff and found to be accurate.  Patient called today via telephone for follow up visit. He stats that he had a cold and didn't want to check his blood pressure so he has not checked it since our last visit. Has not taken his BP medications yet today, but normally takes them before 10AM -it is now only 9:52AM. He states he started taking the lower dose of carvedilol yesterday. Denies dizziness, lightheadedness, headache, blurred vision, SOB and swelling. States that his fatigue has improved. He is not exercising.  I had the patient sit down and chat with me before checking his blood pressure. Pressure today was 138/67 HR 43 per patient.  No other readings available.  Patient states he is out of one capsule. Looks like the only capsule he takes in omeprazole. I advised he needs to call his PCP about getting this one refilled.   Current HTN meds  amlodipine 10mg  daily, carvedilol 3.125mg  twice a day, lisinopril 40mg  daily, HCTZ 25mg  daily, hydralazine 50 mg three times a day (7:30, 12:30, 11PM)  Previously tried: metoprolol (changed to carvedilol for better BP lowering) BP goal: <130/80 mmHg  LDL goal: <55 Risk Factors: history of stroke, coronary CTA, LDL 150   Family History: The patient'sfamily history  includes Blindness in his maternal aunt, Hypertension in his father and mother.  Social History: Neg ETOH, neg tobacco  Diet: 1 cup of coffee per day, does not salt food, wife dose not cook with salt, sometimes eats TV dinner, restaurants twice a week; cutting down on portions and eating broiled chicken  Exercise: None but planning to start doing more yard work  Home BP readings: hasn't checked since last visit  Labs: Lipid panel 12/20/19: TC 111, TG 89, HDL 32, LDL 62  (atorvastatin 80mg  daily, Zetia 10mg  daily)  Wt Readings from Last 3 Encounters:  11/15/19 272 lb 9.6 oz (123.7 kg)  08/19/19 275 lb 1.9 oz (124.8 kg)  10/06/18 258 lb (117 kg)   BP Readings from Last 3 Encounters:  01/05/20 128/66  12/20/19 (!) 155/80  11/15/19 (!) 158/74   Pulse Readings from Last 3 Encounters:  01/05/20 (!) 49  12/20/19 62  11/15/19 74    Renal function: CrCl cannot be calculated (Unknown ideal weight.).  Past Medical History:  Diagnosis Date  . CAD (coronary artery disease), native coronary artery    coronary CTA with occluded RCA with distal collaterals and otherwise nonobstructive disease.  . Essential hypertension   . GERD without esophagitis   . HLD (hyperlipidemia)   . HTN (hypertension)   . LVH (left ventricular hypertrophy)   . Mixed hyperlipidemia   .  Noncompliance   . Obesity   . Other specified hypothyroidism   . Prediabetes   . Stroke [facility])     Current Outpatient Medications on File Prior to Visit  Medication Sig Dispense Refill  . amLODipine (NORVASC) 10 MG tablet Take 1 tablet (10 mg total) by mouth daily. 90 tablet 3  . aspirin EC 81 MG tablet Take 1 tablet (81 mg total) by mouth daily. 90 tablet 3  . atorvastatin (LIPITOR) 80 MG tablet Take 1 tablet (80 mg total) by mouth daily. 90 tablet 3  . carvedilol (COREG) 3.125 MG tablet Take 1 tablet (3.125 mg total) by mouth 2 (two) times daily. 180 tablet 3  . clopidogrel (PLAVIX) 75 MG tablet Take 1 tablet (75  mg total) by mouth daily. 90 tablet 3  . ezetimibe (ZETIA) 10 MG tablet TAKE 1 TABLET BY MOUTH ONCE DAILY. 90 tablet 3  . hydrALAZINE (APRESOLINE) 50 MG tablet Take 1 tablet (50 mg total) by mouth 3 (three) times daily. 270 tablet 3  . hydrochlorothiazide (HYDRODIURIL) 25 MG tablet Take 1 tablet (25 mg total) by mouth daily. 90 tablet 3  . Levothyroxine Sodium 88 MCG CAPS Take 1 capsule (88 mcg total) by mouth daily before breakfast. 30 capsule 0  . lisinopril (ZESTRIL) 40 MG tablet TAKE 1 TABLET BY MOUTH ONCE DAILY. 90 tablet 3  . omeprazole (PRILOSEC) 40 MG capsule Take 40 mg by mouth daily.    . potassium chloride SA (KLOR-CON M20) 20 MEQ tablet Take 1 tablet (20 mEq total) by mouth daily. 90 tablet 3   No current facility-administered medications on file prior to visit.    No Known Allergies   Assessment/Plan:  1. Hypertension - Blood pressure is slightly elevated this AM. I do not have any other readings to compare. HR however is still in the 40's. Patient states he took the lower dose of carvedilol 3.125mg - starting yesterday. I will have him stop this medication completley. Continue amlodipine 10mg  daily, lisinopril 40mg  daily, HCTZ 25mg  daily, hydralazine 50 mg three times a day (7:30, 12:30, 11PM). I have asked the patient to check his blood pressure daily for the next 2 weeks. I will call him to follow up on 5/6 @ 3:00.   Ramond Dial, Pharm.D, BCPS, CPP [facility]  [address] [place], [place] [address]  Phone: [phone]; Fax: [phone]

## 2020-01-19 NOTE — Patient Instructions (Addendum)
Scott Richardson  I was a pleasure speaking with you today.   Please STOP taking carvedilol  Continue amlodipine 10mg  daily, lisinopril 40mg  daily, HCTZ 25mg  daily, hydralazine 50 mg three times a day (7:30, 12:30, 11PM).  Please check your blood pressure once a day and write down your readings.  I will call you on 5/6 @ 3:00 to follow up on your blood pressure.

## 2020-02-02 ENCOUNTER — Telehealth (INDEPENDENT_AMBULATORY_CARE_PROVIDER_SITE_OTHER): Payer: Self-pay | Admitting: Cardiology

## 2020-02-02 ENCOUNTER — Other Ambulatory Visit: Payer: Self-pay

## 2020-02-02 DIAGNOSIS — I10 Essential (primary) hypertension: Secondary | ICD-10-CM

## 2020-02-02 NOTE — Progress Notes (Signed)
Patient ID: Scott Richardson                 DOB: 04-Jun-1956                      MRN: [id number]     HPI: Scott Richardson is a 64 y.o. male referred by Dr. Mayford Knife to HTN clinic. PMH is significant for HTN, HLD, OSA, stroke, ASCADwithcoronary CTAshowingoccluded RCA with distal collaterals and otherwise nonobstructive disease. He has a history of hypertensive DCM which resolved on aggressive medical therapy of his HTN.   Patient last seen in the HTN clinic on 01/19/20. His blood pressure was slightly above goal. However patient was still bradycardic with HR in the 40's even with carvedilol dose decrease. Therefore carvedilol was stopped.He had not been checking blood pressure at home, so only a single blood pressure was available. His home blood pressure cuff was previously compared to clinic cuff and found to be accurate.  Patient called today via telephone for follow up visit.  Denies dizziness, lightheadedness, headache, blurred vision, SOB and swelling. Sleepy around 9:30-10:00 AM gets a little sleepy. Takes a 45 min nap and then he's good.  Current HTN meds: amlodipine 10mg  daily, lisinopril 40mg  daily, HCTZ 25mg  daily, hydralazine 50 mg three times a day (7:30, 12:30, 11PM)  Previously tried: metoprolol (changed to carvedilol for better BP lowering) BP goal: <130/80 mmHg  LDL goal: <55 Risk Factors: history of stroke, coronary CTA, LDL 150   Family History: The patient'sfamily history includes Blindness in his maternal aunt, Hypertension in his father and mother.  Social History: Neg ETOH, neg tobacco  Diet: 1 cup of coffee per day, does not salt food, wife dose not cook with salt, sometimes eats TV dinner, restaurants twice a week; cutting down on portions and eating broiled chicken  Exercise: None but planning to start doing more yard work  Home BP readings: 118/47, 126/52, 129/58, 128/52, 132/57, 128/52  HR 61  Labs: Lipid panel 12/20/19: TC 111, TG 89, HDL 32, LDL 62   (atorvastatin 80mg  daily, Zetia 10mg  daily)  Wt Readings from Last 3 Encounters:  11/15/19 272 lb 9.6 oz (123.7 kg)  08/19/19 275 lb 1.9 oz (124.8 kg)  10/06/18 258 lb (117 kg)   BP Readings from Last 3 Encounters:  01/19/20 139/67  01/05/20 128/66  12/20/19 (!) 155/80   Pulse Readings from Last 3 Encounters:  01/19/20 (!) 43  01/05/20 (!) 49  12/20/19 62    Renal function: CrCl cannot be calculated (Patient's most recent lab result is older than the maximum 21 days allowed.).  Past Medical History:  Diagnosis Date  . CAD (coronary artery disease), native coronary artery    coronary CTA with occluded RCA with distal collaterals and otherwise nonobstructive disease.  . Essential hypertension   . GERD without esophagitis   . HLD (hyperlipidemia)   . HTN (hypertension)   . LVH (left ventricular hypertrophy)   . Mixed hyperlipidemia   . Noncompliance   . Obesity   . Other specified hypothyroidism   . Prediabetes   . Stroke [facility])     Current Outpatient Medications on File Prior to Visit  Medication Sig Dispense Refill  . amLODipine (NORVASC) 10 MG tablet Take 1 tablet (10 mg total) by mouth daily. 90 tablet 3  . aspirin EC 81 MG tablet Take 1 tablet (81 mg total) by mouth daily. 90 tablet 3  . atorvastatin (LIPITOR) 80 MG tablet Take 1 tablet (  80 mg total) by mouth daily. 90 tablet 3  . carvedilol (COREG) 3.125 MG tablet Take 1 tablet (3.125 mg total) by mouth 2 (two) times daily. 180 tablet 3  . clopidogrel (PLAVIX) 75 MG tablet Take 1 tablet (75 mg total) by mouth daily. 90 tablet 3  . ezetimibe (ZETIA) 10 MG tablet TAKE 1 TABLET BY MOUTH ONCE DAILY. 90 tablet 3  . hydrALAZINE (APRESOLINE) 50 MG tablet Take 1 tablet (50 mg total) by mouth 3 (three) times daily. 270 tablet 3  . hydrochlorothiazide (HYDRODIURIL) 25 MG tablet Take 1 tablet (25 mg total) by mouth daily. 90 tablet 3  . Levothyroxine Sodium 88 MCG CAPS Take 1 capsule (88 mcg total) by mouth daily before  breakfast. 30 capsule 0  . lisinopril (ZESTRIL) 40 MG tablet TAKE 1 TABLET BY MOUTH ONCE DAILY. 90 tablet 3  . omeprazole (PRILOSEC) 40 MG capsule Take 40 mg by mouth daily.    . potassium chloride SA (KLOR-CON M20) 20 MEQ tablet Take 1 tablet (20 mEq total) by mouth daily. 90 tablet 3   No current facility-administered medications on file prior to visit.    No Known Allergies   Assessment/Plan:  1. Hypertension - Blood pressure is at goal of <130/80. Continue amlodipine 10mg  daily, lisinopril 40mg  daily, HCTZ 25mg  daily, hydralazine 50 mg three times a day (7:30, 12:30, 11PM). I do not think his sleepiness is related to blood pressure. I have encouraged patient to continue to increase exercise. Follow up as needed.  Ramond Dial, Pharm.D, BCPS, CPP [place]  [address] [place], [place] [address]  Phone: [phone]; Fax: [phone]

## 2020-11-13 ENCOUNTER — Ambulatory Visit: Payer: Self-pay | Admitting: Cardiology

## 2020-12-04 ENCOUNTER — Ambulatory Visit (INDEPENDENT_AMBULATORY_CARE_PROVIDER_SITE_OTHER): Payer: Self-pay | Admitting: Cardiology

## 2020-12-04 ENCOUNTER — Other Ambulatory Visit: Payer: Self-pay

## 2020-12-04 VITALS — BP 156/84 | HR 54 | Ht 68.0 in | Wt 258.2 lb

## 2020-12-04 DIAGNOSIS — I10 Essential (primary) hypertension: Secondary | ICD-10-CM

## 2020-12-04 DIAGNOSIS — I43 Cardiomyopathy in diseases classified elsewhere: Secondary | ICD-10-CM

## 2020-12-04 DIAGNOSIS — I639 Cerebral infarction, unspecified: Secondary | ICD-10-CM

## 2020-12-04 DIAGNOSIS — Z91199 Patient's noncompliance with other medical treatment and regimen due to unspecified reason: Secondary | ICD-10-CM

## 2020-12-04 DIAGNOSIS — G4733 Obstructive sleep apnea (adult) (pediatric): Secondary | ICD-10-CM

## 2020-12-04 DIAGNOSIS — I2510 Atherosclerotic heart disease of native coronary artery without angina pectoris: Secondary | ICD-10-CM

## 2020-12-04 DIAGNOSIS — I119 Hypertensive heart disease without heart failure: Secondary | ICD-10-CM

## 2020-12-04 DIAGNOSIS — E7800 Pure hypercholesterolemia, unspecified: Secondary | ICD-10-CM

## 2020-12-04 DIAGNOSIS — Z9119 Patient's noncompliance with other medical treatment and regimen: Secondary | ICD-10-CM

## 2020-12-04 LAB — COMPREHENSIVE METABOLIC PANEL
ALT: 21 IU/L (ref 0–44)
AST: 18 IU/L (ref 0–40)
Albumin/Globulin Ratio: 1.4 (ref 1.2–2.2)
Albumin: 4.3 g/dL (ref 3.8–4.8)
Alkaline Phosphatase: 68 IU/L (ref 44–121)
BUN/Creatinine Ratio: 9 — ABNORMAL LOW (ref 10–24)
BUN: 9 mg/dL (ref 8–27)
Bilirubin Total: 0.8 mg/dL (ref 0.0–1.2)
CO2: 20 mmol/L (ref 20–29)
Calcium: 9.8 mg/dL (ref 8.6–10.2)
Chloride: 100 mmol/L (ref 96–106)
Creatinine, Ser: 0.96 mg/dL (ref 0.76–1.27)
Globulin, Total: 3.1 g/dL (ref 1.5–4.5)
Glucose: 109 mg/dL — ABNORMAL HIGH (ref 65–99)
Potassium: 4.3 mmol/L (ref 3.5–5.2)
Sodium: 136 mmol/L (ref 134–144)
Total Protein: 7.4 g/dL (ref 6.0–8.5)
eGFR: 88 mL/min/{1.73_m2} (ref >59)

## 2020-12-04 LAB — LIPID PANEL
Chol/HDL Ratio: 5.8 ratio — ABNORMAL HIGH (ref 0.0–5.0)
Cholesterol, Total: 216 mg/dL — ABNORMAL HIGH (ref 100–199)
HDL: 37 mg/dL — ABNORMAL LOW (ref >39)
LDL Chol Calc (NIH): 161 mg/dL — ABNORMAL HIGH (ref 0–99)
Triglycerides: 102 mg/dL (ref 0–149)
VLDL Cholesterol Cal: 18 mg/dL (ref 5–40)

## 2020-12-04 MED ORDER — CARVEDILOL 3.125 MG PO TABS: 3.1250 mg | ORAL_TABLET | Freq: Two times a day (BID) | ORAL | 3 refills | Status: AC

## 2020-12-04 MED ORDER — EZETIMIBE 10 MG PO TABS: ORAL_TABLET | ORAL | 3 refills | Status: AC

## 2020-12-04 MED ORDER — CLOPIDOGREL BISULFATE 75 MG PO TABS: 75.0000 mg | ORAL_TABLET | Freq: Every day | ORAL | 3 refills | Status: AC

## 2020-12-04 MED ORDER — HYDRALAZINE HCL 10 MG PO TABS: 10.0000 mg | ORAL_TABLET | Freq: Three times a day (TID) | ORAL | 3 refills | Status: DC

## 2020-12-04 MED ORDER — POTASSIUM CHLORIDE CRYS ER 20 MEQ PO TBCR: 20.0000 meq | EXTENDED_RELEASE_TABLET | Freq: Every day | ORAL | 3 refills | Status: AC

## 2020-12-04 MED ORDER — LISINOPRIL 40 MG PO TABS: ORAL_TABLET | ORAL | 3 refills | Status: AC

## 2020-12-04 MED ORDER — AMLODIPINE BESYLATE 10 MG PO TABS: 10.0000 mg | ORAL_TABLET | Freq: Every day | ORAL | 3 refills | Status: AC

## 2020-12-04 MED ORDER — HYDROCHLOROTHIAZIDE 25 MG PO TABS: 25.0000 mg | ORAL_TABLET | Freq: Every day | ORAL | 3 refills | Status: AC

## 2020-12-04 MED ORDER — ATORVASTATIN CALCIUM 80 MG PO TABS: 80.0000 mg | ORAL_TABLET | Freq: Every day | ORAL | 3 refills | Status: AC

## 2020-12-04 NOTE — Progress Notes (Signed)
Cardiology Office Note:    Date:  12/04/2020   ID:  Scott Richardson, DOB 1956-05-24, MRN [id number]  PCP:  Scott Miyamoto, MD  Cardiologist:  Armanda Magic, MD    Referring MD: Scott Richardson,*   Chief Complaint  Patient presents with  . Coronary Artery Disease  . Hypertension  . Hyperlipidemia    History of Present Illness:    Scott Richardson is a 65 y.o. male with a hx of ASCADwithcoronary CTAshowingoccluded RCA with distal collaterals and otherwise nonobstructive disease.  He has a history of hypertensive DCM which resolved on aggressive medical therapy of his HTN. He recently had a sleep study done showing  moderate OSA with an AHI if 21.3/hr and nocturnal hypoxemia with 41% of the time spent with O2 sats < 85%with a minimum O2 sat of 76%.  He underwent CPAP titration to 14cm H2O.  He was seen in Nov 2020 and unfortunately he has no insurance and could not afford his device.   He is here today for followup and is doing well.  He denies any chest pain or pressure, SOB, DOE, PND, orthopnea, LE edema, dizziness, palpitations or syncope. He is compliant with his meds and is tolerating meds with no SE.    Past Medical History:  Diagnosis Date  . CAD (coronary artery disease), native coronary artery    coronary CTA with occluded RCA with distal collaterals and otherwise nonobstructive disease.  . Essential hypertension   . GERD without esophagitis   . HLD (hyperlipidemia)   . HTN (hypertension)   . LVH (left ventricular hypertrophy)   . Mixed hyperlipidemia   . Noncompliance   . Obesity   . Other specified hypothyroidism   . Prediabetes   . Stroke [facility])     Past Surgical History:  Procedure Laterality Date  . NO PAST SURGERIES    . TEE WITHOUT CARDIOVERSION N/A 12/01/2013   Procedure: TRANSESOPHAGEAL ECHOCARDIOGRAM (TEE);  Surgeon: Vesta Mixer, MD;  Location: [facility] ENDOSCOPY;  Service: Cardiovascular;  Laterality: N/A;    Current Medications: Current Meds   Medication Sig  . aspirin EC 81 MG tablet Take 1 tablet (81 mg total) by mouth daily.  . Levothyroxine Sodium 88 MCG CAPS Take 1 capsule (88 mcg total) by mouth daily before breakfast.  . [DISCONTINUED] amLODipine (NORVASC) 10 MG tablet Take 1 tablet (10 mg total) by mouth daily.  . [DISCONTINUED] atorvastatin (LIPITOR) 80 MG tablet Take 1 tablet (80 mg total) by mouth daily.  . [DISCONTINUED] carvedilol (COREG) 3.125 MG tablet Take 1 tablet (3.125 mg total) by mouth 2 (two) times daily.  . [DISCONTINUED] clopidogrel (PLAVIX) 75 MG tablet Take 1 tablet (75 mg total) by mouth daily.  . [DISCONTINUED] ezetimibe (ZETIA) 10 MG tablet TAKE 1 TABLET BY MOUTH ONCE DAILY.  . [DISCONTINUED] hydrochlorothiazide (HYDRODIURIL) 25 MG tablet Take 1 tablet (25 mg total) by mouth daily.  . [DISCONTINUED] lisinopril (ZESTRIL) 40 MG tablet TAKE 1 TABLET BY MOUTH ONCE DAILY.  . [DISCONTINUED] omeprazole (PRILOSEC) 40 MG capsule Take 40 mg by mouth daily.  . [DISCONTINUED] potassium chloride SA (KLOR-CON M20) 20 MEQ tablet Take 1 tablet (20 mEq total) by mouth daily.     Allergies:   Patient has no known allergies.   Social History   Socioeconomic History  . Marital status: Married    Spouse name: Scott Richardson  . Number of children: 2  . Years of education: HS  . Highest education level: Not on file  Occupational History  Employer: OTHER    Comment: n/a  Tobacco Use  . Smoking status: Never Smoker  . Smokeless tobacco: Never Used  Vaping Use  . Vaping Use: Never used  Substance and Sexual Activity  . Alcohol use: No    Alcohol/week: 0.0 standard drinks  . Drug use: No  . Sexual activity: Not Currently  Other Topics Concern  . Not on file  Social History Narrative   Patient is married with 2 children.   Patient is right handed.   Patient has hs education.   Patient drinks 1-2 cups daily.   Social Determinants of Health   Financial Resource Strain: Not on file  Food Insecurity: Not on  file  Transportation Needs: Not on file  Physical Activity: Not on file  Stress: Not on file  Social Connections: Not on file     Family History: The patient's family history includes Blindness in his maternal aunt, maternal aunt, maternal aunt, and maternal aunt; Hypertension in his father and mother.  ROS:   Please see the history of present illness.    ROS  All other systems reviewed and negative.   EKGs/Labs/Other Studies Reviewed:    The following studies were reviewed today: Sleep study, CPAP titration and PAP compliance download from [facility]  EKG:  EKG is  ordered today and demonstrates sinus bradycardia with inferior infarct and RBBB  Recent Labs: 12/20/2019: ALT 38 01/05/2020: BUN 11; Creatinine, Ser 0.90; Potassium 4.2; Sodium 137   Recent Lipid Panel    Component Value Date/Time   CHOL 111 12/20/2019 0829   TRIG 89 12/20/2019 0829   HDL 32 (L) 12/20/2019 0829   CHOLHDL 3.5 12/20/2019 0829   CHOLHDL 3.5 11/19/2015 0850   VLDL 15 11/19/2015 0850   LDLCALC 62 12/20/2019 0829    Physical Exam:    VS:  BP (!) 156/84   Pulse (!) 54   Ht 5\' 8"  (1.727 m)   Wt 258 lb 3.2 oz (117.1 kg)   BMI 39.26 kg/m     Wt Readings from Last 3 Encounters:  12/04/20 258 lb 3.2 oz (117.1 kg)  11/15/19 272 lb 9.6 oz (123.7 kg)  08/19/19 275 lb 1.9 oz (124.8 kg)    GEN: Well nourished, well developed in no acute distress HEENT: Normal NECK: No JVD; No carotid bruits LYMPHATICS: No lymphadenopathy CARDIAC:RRR, no murmurs, rubs, gallops RESPIRATORY:  Clear to auscultation without rales, wheezing or rhonchi  ABDOMEN: Soft, non-tender, non-distended MUSCULOSKELETAL:  No edema; No deformity  SKIN: Warm and dry NEUROLOGIC:  Alert and oriented x 3 PSYCHIATRIC:  Normal affect    ASSESSMENT:    1. Coronary artery disease involving native coronary artery of native heart without angina pectoris   2. Essential hypertension   3. 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 OSA (obstructive sleep apnea)   5. Pure hypercholesterolemia   6. Hypertension, unspecified type   7. Cerebrovascular accident (CVA), unspecified mechanism (HCC)   8. Noncompliance    PLAN:    In order of problems listed above:  1.  ASCAD -coronary CTAshowedoccluded RCA with distal collaterals and otherwise nonobstructive disease. -he has not had any anginal symptoms since I saw him last -Continue ASA 81mg  daily, Plavix 75mg  daily, statin and BB  2.  HTN -BP is borderline controlled on exam today but has not taken his meds today but says at home runs 148-164mmHg -continue Amlodipine 10mg  daily, HCTZ 25mg  daily, Lisinopril 40mg  daily and Carvedilol 3.125mg  BID -cannot  increase carvedilol further due to bradycardia -restart Hydralazine 10mg  TID -followup in PharmD clinic in 2 weeks -check BMET  3.  Hypertensive DCM -EF normalized on echo 2015 with EF 55-60%  4.  OSA -he was never able to get a device due to lack of insurance and has no interest in proceeding with it at this time.  5.  Hyperlipidemia -LDL goal < 70 -check FLP and ALT -continue Zetia 10mg  daily and atorvastatin 80mg  daily   Medication Adjustments/Labs and Tests Ordered: Current medicines are reviewed at length with the patient today.  Concerns regarding medicines are outlined above.  Orders Placed This Encounter  Procedures  . EKG 12-Lead   Meds ordered this encounter  Medications  . amLODipine (NORVASC) 10 MG tablet    Sig: Take 1 tablet (10 mg total) by mouth daily.    Dispense:  90 tablet    Refill:  3  . atorvastatin (LIPITOR) 80 MG tablet    Sig: Take 1 tablet (80 mg total) by mouth daily.    Dispense:  90 tablet    Refill:  3  . carvedilol (COREG) 3.125 MG tablet    Sig: Take 1 tablet (3.125 mg total) by mouth 2 (two) times daily.    Dispense:  180 tablet    Refill:  3  . clopidogrel (PLAVIX) 75 MG tablet    Sig: Take 1 tablet (75 mg total) by mouth daily.     Dispense:  90 tablet    Refill:  3  . ezetimibe (ZETIA) 10 MG tablet    Sig: TAKE 1 TABLET BY MOUTH ONCE DAILY.    Dispense:  90 tablet    Refill:  3  . hydrochlorothiazide (HYDRODIURIL) 25 MG tablet    Sig: Take 1 tablet (25 mg total) by mouth daily.    Dispense:  90 tablet    Refill:  3  . lisinopril (ZESTRIL) 40 MG tablet    Sig: TAKE 1 TABLET BY MOUTH ONCE DAILY.    Dispense:  90 tablet    Refill:  3  . potassium chloride SA (KLOR-CON M20) 20 MEQ tablet    Sig: Take 1 tablet (20 mEq total) by mouth daily.    Dispense:  90 tablet    Refill:  3    Signed, 2016, MD  12/04/2020 10:16 AM    [facility]

## 2020-12-04 NOTE — Addendum Note (Signed)
Addended by: Theresia Majors on: 12/04/2020 10:22 AM   Modules accepted: Orders

## 2020-12-04 NOTE — Patient Instructions (Signed)
Medication Instructions:  Your physician has recommended you make the following change in your medication:  1) START taking hydralazine 10 mg three times per day  *If you need a refill on your cardiac medications before your next appointment, please call your pharmacy*   Lab Work: TODAY: CMET and fasting lipids If you have labs (blood work) drawn today and your tests are completely normal, you will receive your results only by: Marland Kitchen MyChart Message (if you have MyChart) OR . A paper copy in the mail If you have any lab test that is abnormal or we need to change your treatment, we will call you to review the results.  Follow-Up: At [facility], you and your health needs are our priority.  As part of our continuing mission to provide you with exceptional heart care, we have created designated Provider Care Teams.  These Care Teams include your primary Cardiologist (physician) and Advanced Practice Providers (APPs -  Physician Assistants and Nurse Practitioners) who all work together to provide you with the care you need, when you need it.  Your next appointment:   1 year(s)  The format for your next appointment:   In Person  Provider:   You may see Armanda Magic, MD or one of the following Advanced Practice Providers on your designated Care Team:    Ronie Spies, PA-C  Jacolyn Reedy, PA-C  Other Instructions You have been referred to see our Pharmacist in the Hypertension clinic in two weeks.

## 2020-12-20 ENCOUNTER — Ambulatory Visit: Payer: Self-pay

## 2021-01-04 ENCOUNTER — Ambulatory Visit: Payer: Self-pay

## 2021-01-07 ENCOUNTER — Ambulatory Visit (INDEPENDENT_AMBULATORY_CARE_PROVIDER_SITE_OTHER): Payer: Self-pay

## 2021-01-07 ENCOUNTER — Other Ambulatory Visit: Payer: Self-pay

## 2021-01-07 VITALS — BP 158/82 | HR 59

## 2021-01-07 DIAGNOSIS — E7800 Pure hypercholesterolemia, unspecified: Secondary | ICD-10-CM

## 2021-01-07 DIAGNOSIS — I10 Essential (primary) hypertension: Secondary | ICD-10-CM

## 2021-01-07 NOTE — Progress Notes (Signed)
Patient ID: Scott Richardson                 DOB: 1956/04/05                      MRN: [id number]     HPI: Scott Richardson is a 66 y.o. male referred by Dr. Mayford Knife to HTN clinic. PMH is significant for HTN, HLD, OSA, stroke, ASCADwithcoronary CTAshowingoccluded RCA with distal collaterals and otherwise nonobstructive disease. He has a history of hypertensive DCM which resolved on aggressive medical therapy of his HTN.   Patient previously seen in the HTN clinic on 01/19/20. His blood pressure was slightly above goal. However patient was still bradycardic with HR in the 40's even with carvedilol dose decrease. Therefore carvedilol was stopped. This has since been resumed by Dr. Mayford Knife at 3.25 mg BID. His blood pressure was at goal on virtual visit with PharmD on 5/6. At that time he was on hydralazine 50mg  three times a day. He saw Dr. on 12/04/20 at which time he reported not taking the hydralazine.   Today, patient presents alone and in good spirits. At start of visit, wife was contacted over the phone to join the session remotely. Patient denies any dizziness, lightheadedness, headache, blurred vision, SOB, or swelling. His in-clinic blood pressure was elevated at 158/82. He is not currently checking his blood pressure at home. He has managed to increase his exercise to 20 minutes of walking a day for the last 3 weeks, which he is very happy about. When discussing medications, it appears he may not have been taking his hydralazine TID as intended. Additionally, while he has been filling his medications on time (confirmed with pharmacy) there is some confusion surrounding what he is and isn't taking.   Current HTN meds: amlodipine 10mg  daily, lisinopril 40mg  daily, HCTZ 25mg  daily, hydralazine 10 mg three times a day (7:30, 12:30, 11PM), carvedilol 3.125 mg twice a day  Current lipid meds: atrovastatin 80mg  daily? Zetia 10mg  daily?   Previously tried: metoprolol (changed to carvedilol for better BP  lowering) BP goal: <130/80 mmHg  LDL goal: <55 Risk Factors: history of stroke, coronary CTA, LDL 150   Family History: The patient'sfamily history includes Blindness in his maternal aunt, Hypertension in his father and mother.  Social History: Neg ETOH, neg tobacco  Diet: 1 cup of coffee per day, does not salt food, wife dose not cook with salt, sometimes eats TV dinner, 02/03/21 restaurants twice a week; cutting down on portions and eating broiled chicken.  - Was eating boiled chicken, rice, and soft foods for the last ~9 months due to teeth removal.   Exercise: Now walking for about ~20 minutes a day for the last 3 weeks.   Home BP readings: not checking at home   Clinic readings: HR 59, BP 158/82  Labs: Lipid panel 12/20/19: TC 111, TG 89, HDL 32, LDL 62  (atorvastatin 80mg  daily, Zetia 10mg  daily)  Wt Readings from Last 3 Encounters:  12/04/20 258 lb 3.2 oz (117.1 kg)  11/15/19 272 lb 9.6 oz (123.7 kg)  08/19/19 275 lb 1.9 oz (124.8 kg)   BP Readings from Last 3 Encounters:  12/04/20 (!) 156/84  01/19/20 139/67  01/05/20 128/66   Pulse Readings from Last 3 Encounters:  12/04/20 (!) 54  01/19/20 (!) 43  01/05/20 (!) 49    Renal function: CrCl cannot be calculated (Patient's most recent lab result is older than the maximum 21 days allowed.).  Past Medical History:  Diagnosis Date  . CAD (coronary artery disease), native coronary artery    coronary CTA with occluded RCA with distal collaterals and otherwise nonobstructive disease.  . Essential hypertension   . GERD without esophagitis   . HLD (hyperlipidemia)   . HTN (hypertension)   . LVH (left ventricular hypertrophy)   . Mixed hyperlipidemia   . Noncompliance   . Obesity   . Other specified hypothyroidism   . Prediabetes   . Stroke [facility])     Current Outpatient Medications on File Prior to Visit  Medication Sig Dispense Refill  . amLODipine (NORVASC) 10 MG tablet Take 1 tablet (10 mg total) by mouth  daily. 90 tablet 3  . aspirin EC 81 MG tablet Take 1 tablet (81 mg total) by mouth daily. 90 tablet 3  . atorvastatin (LIPITOR) 80 MG tablet Take 1 tablet (80 mg total) by mouth daily. 90 tablet 3  . carvedilol (COREG) 3.125 MG tablet Take 1 tablet (3.125 mg total) by mouth 2 (two) times daily. 180 tablet 3  . clopidogrel (PLAVIX) 75 MG tablet Take 1 tablet (75 mg total) by mouth daily. 90 tablet 3  . ezetimibe (ZETIA) 10 MG tablet TAKE 1 TABLET BY MOUTH ONCE DAILY. 90 tablet 3  . hydrALAZINE (APRESOLINE) 10 MG tablet Take 1 tablet (10 mg total) by mouth 3 (three) times daily. 270 tablet 3  . hydrochlorothiazide (HYDRODIURIL) 25 MG tablet Take 1 tablet (25 mg total) by mouth daily. 90 tablet 3  . Levothyroxine Sodium 88 MCG CAPS Take 1 capsule (88 mcg total) by mouth daily before breakfast. 30 capsule 0  . lisinopril (ZESTRIL) 40 MG tablet TAKE 1 TABLET BY MOUTH ONCE DAILY. 90 tablet 3  . potassium chloride SA (KLOR-CON M20) 20 MEQ tablet Take 1 tablet (20 mEq total) by mouth daily. 90 tablet 3   No current facility-administered medications on file prior to visit.    No Known Allergies   Assessment/Plan:  1. Hypertension - Patient's blood pressure remains above goal (<130/80) despite multiple antihypertensives. Patient would benefit from further optimization of his medication regimen for improved blood pressure control. Instructed patient to begin checking his blood pressure daily. Discussed extensively with patient and wife on how to take his medications appropriately. Asked patient to bring in medication bottles and pill box to next visit. Discussed ways to improve diet, and encouraged patient to continue with increasing exercise. Patient will follow-up in clinic in 3 weeks to reassess blood pressure on correct regimen. We will also need to schedule a repeat lipid panel in ~2 months.   2. Hyperlipidemia- LDL has increased about 100 points in a year. He reports not taking Zetia but thinks he  has been taking atorvastatin. LDL is above goal of <55. I have asked patient to make sure he is taking both atorvastatin 80mg  and zetia 10mg  daily. Will recheck lipids at the end of May. Will schedule at next visit. If LDL still elevated and patient has been compliant then will need to discuss PCSK9i vs Nexlizet.  Medications: Blood pressure: - Amlodipine 10mg  daily - Lisinopril 40mg  daily - HCTZ 25mg  daily - Hydralazine 50 mg three times a day  - Carvedilol 3.125 mg twice a day Cholesterol: - Atorvastatin 80 mg daily - Ezetimibe 10 mg daily   , PharmD PGY1 Acute Care Pharmacy Resident

## 2021-01-07 NOTE — Patient Instructions (Addendum)
Thank you for visiting [place] today!  Keep up the great work with diet and exercise!   Your blood pressure was above goal (<130/80) at 158/82 and your cholesterol was elevated at 163.  Medications: Blood pressure: - Amlodipine 10mg  daily - Lisinopril 40mg  daily - HCTZ 25mg  daily - Hydralazine 50 mg three times a day  - Carvedilol 3.125 mg twice a day Cholesterol: - Atorvastatin 80 mg daily - Ezetimibe 10 mg daily   We will see you again in clinic in 3 weeks (May 4th, 9:30AM) Please bring your medications and pill box with you.   Start checking your blood pressure once a day and bring a log of values with you in 2 weeks.  We will also check your cholesterol in ~2 months (May 30th, anytime after 7am)

## 2021-01-30 ENCOUNTER — Other Ambulatory Visit: Payer: Self-pay

## 2021-01-30 ENCOUNTER — Ambulatory Visit (INDEPENDENT_AMBULATORY_CARE_PROVIDER_SITE_OTHER): Payer: Self-pay | Admitting: Pharmacist

## 2021-01-30 VITALS — BP 138/76 | HR 51

## 2021-01-30 DIAGNOSIS — I10 Essential (primary) hypertension: Secondary | ICD-10-CM

## 2021-01-30 MED ORDER — HYDRALAZINE HCL 25 MG PO TABS: 25.0000 mg | ORAL_TABLET | Freq: Three times a day (TID) | ORAL | 3 refills | Status: AC

## 2021-01-30 NOTE — Progress Notes (Signed)
Patient ID: Scott Richardson                 DOB: 1956/05/31                      MRN: [id number]     HPI: Scott Richardson is a 65 y.o. male referred by Dr. Mayford Knife to HTN clinic. PMH is significant for HTN, HLD, OSA, stroke, ASCADwithcoronary CTAshowingoccluded RCA with distal collaterals and otherwise nonobstructive disease. He has a history of hypertensive DCM which resolved on aggressive medical therapy of his HTN.   Patient previously seen in the HTN clinic on 01/07/21. His blood pressure was above goal at 158/82. Patient had been followed previously. Blood pressure was under pretty good control. However when seen by Dr. Mayford Knife on 3/8, his blood pressure was elevated. It was discovered he was not taking his hydralazine. At follow up on 4/11 patient was not taking hydralazine three times a day.   Patient presents today for follow up. He denies dizziness, lightheadedness, headache, blurred vision, SOB or swelling. He took his AM medications around 7:45 this AM. He has been walking for about , 5 days a week. He has not been checking his blood pressure at home. States he feels great.  Current HTN meds: amlodipine 10mg  daily, lisinopril 40mg  daily, HCTZ 25mg  daily, hydralazine 10 mg three times a day, carvedilol 3.125 mg twice a day  Current lipid meds: atrovastatin 80mg  daily, Zetia 10mg  daily   Previously tried: metoprolol (changed to carvedilol for better BP lowering) BP goal: <130/80 mmHg  LDL goal: <55 Risk Factors: history of stroke, coronary CTA, LDL 150   Family History: The patient'sfamily history includes Blindness in his maternal aunt, Hypertension in his father and mother.  Social History: Neg ETOH, neg tobacco  Diet: 1 cup of coffee per day, does not salt food, wife dose not cook with salt, sometimes eats TV dinner, restaurants twice a week; cutting down on portions and eating broiled chicken.  - Was eating boiled chicken, rice, and soft foods for the last ~9 months due  to teeth removal.   Exercise: walking ~79min, 5 days a week   Home BP readings: not checking at home   Clinic readings:   Labs: Lipid panel 12/20/19: TC 111, TG 89, HDL 32, LDL 62  (atorvastatin 80mg  daily, Zetia 10mg  daily)  Wt Readings from Last 3 Encounters:  12/04/20 258 lb 3.2 oz (117.1 kg)  11/15/19 272 lb 9.6 oz (123.7 kg)  08/19/19 275 lb 1.9 oz (124.8 kg)   BP Readings from Last 3 Encounters:  01/07/21 (!) 158/82  12/04/20 (!) 156/84  01/19/20 139/67   Pulse Readings from Last 3 Encounters:  01/07/21 (!) 59  12/04/20 (!) 54  01/19/20 (!) 43    Renal function: CrCl cannot be calculated (Patient's most recent lab result is older than the maximum 21 days allowed.).  Past Medical History:  Diagnosis Date  . CAD (coronary artery disease), native coronary artery    coronary CTA with occluded RCA with distal collaterals and otherwise nonobstructive disease.  . Essential hypertension   . GERD without esophagitis   . HLD (hyperlipidemia)   . HTN (hypertension)   . LVH (left ventricular hypertrophy)   . Mixed hyperlipidemia   . Noncompliance   . Obesity   . Other specified hypothyroidism   . Prediabetes   . Stroke [facility])     Current Outpatient Medications on File Prior to Visit  Medication Sig Dispense  Refill  . amLODipine (NORVASC) 10 MG tablet Take 1 tablet (10 mg total) by mouth daily. 90 tablet 3  . aspirin EC 81 MG tablet Take 1 tablet (81 mg total) by mouth daily. 90 tablet 3  . atorvastatin (LIPITOR) 80 MG tablet Take 1 tablet (80 mg total) by mouth daily. 90 tablet 3  . carvedilol (COREG) 3.125 MG tablet Take 1 tablet (3.125 mg total) by mouth 2 (two) times daily. 180 tablet 3  . clopidogrel (PLAVIX) 75 MG tablet Take 1 tablet (75 mg total) by mouth daily. 90 tablet 3  . ezetimibe (ZETIA) 10 MG tablet TAKE 1 TABLET BY MOUTH ONCE DAILY. 90 tablet 3  . hydrALAZINE (APRESOLINE) 10 MG tablet Take 1 tablet (10 mg total) by mouth 3 (three) times daily. 270 tablet  3  . hydrochlorothiazide (HYDRODIURIL) 25 MG tablet Take 1 tablet (25 mg total) by mouth daily. 90 tablet 3  . Levothyroxine Sodium 88 MCG CAPS Take 1 capsule (88 mcg total) by mouth daily before breakfast. 30 capsule 0  . lisinopril (ZESTRIL) 40 MG tablet TAKE 1 TABLET BY MOUTH ONCE DAILY. 90 tablet 3  . potassium chloride SA (KLOR-CON M20) 20 MEQ tablet Take 1 tablet (20 mEq total) by mouth daily. 90 tablet 3   No current facility-administered medications on file prior to visit.    No Known Allergies   Assessment/Plan:  1. Hypertension - Patient's blood pressure remains above goal (<130/80) in clinic. No home readings to compare. Will increase hydralazine to 25mg  three times a day. Advised patient that he may take 2 of the 10mg  tablets three times a day until he runs out, then he can start 25mg  three times a day. Continue amlodipine 10mg  daily, lisinopril 40mg  daily, HCTZ 25mg  daily and carvedilol 3.125 mg twice a day. I have asked the patient to check his blood pressure at home once a day and bring his log with him to next appointment. Follow up in 1 month.  2. Hyperlipidemia- Recheck lipids at next apt 6/1  , Pharm.D, BCPS, CPP [facility]  [address] [place],   Phone: [phone]; Fax: [phone]

## 2021-01-30 NOTE — Patient Instructions (Addendum)
It was nice to see you again today!  Please start checking your blood pressure at home once a day and bring your readings to your next appointment  Please increase your hydralazine to 20mg  (2 tablets) three times a day until your run out of 10mg  tablets. Once you run out of the 10mg  tablets- I have called in the 25mg  tablets for you. Please take 1 (25mg  tablet) three times a day.  Continue amlodipine 10mg  daily, lisinopril 40mg  daily, HCTZ 25mg  daily and carvedilol 3.125 mg twice a day  Call me at [phone] with any questions

## 2021-02-27 ENCOUNTER — Ambulatory Visit: Payer: Self-pay

## 2021-02-27 ENCOUNTER — Other Ambulatory Visit: Payer: Self-pay

## 2021-02-27 NOTE — Progress Notes (Deleted)
Patient ID: Scott Richardson                 DOB: 05-Aug-1956                      MRN: [id number]     HPI: Scott Richardson is a 65 y.o. male referred by Dr. Mayford Knife to HTN clinic. PMH is significant for HTN, HLD, OSA, stroke, ASCADwithcoronary CTAshowingoccluded RCA with distal collaterals and otherwise nonobstructive disease. He has a history of hypertensive DCM which resolved on aggressive medical therapy of his HTN.    Patient had been followed previously. Blood pressure was under pretty good control. However when seen by Dr. Mayford Knife on 3/8, his blood pressure was elevated. It was discovered he was not taking his hydralazine. At follow up on 4/11 patient was not taking hydralazine three times a day.   Patient last seen in the HTN clinic on 01/30/21. His blood pressure was above goal at 138/76. Hydralazine was increased to 25mg  three times a day.  Dizziness, lightheadedness, headache, blurred vision, SOB, swelling Walking? Lipid panel Home readings?   Patient presents today for follow up. He denies dizziness, lightheadedness, headache, blurred vision, SOB or swelling. He took his AM medications around 7:45 this AM. He has been walking for about , 5 days a week. He has not been checking his blood pressure at home. States he feels great.  Current HTN meds: amlodipine 10mg  daily, lisinopril 40mg  daily, HCTZ 25mg  daily, hydralazine 25mg  three times a day, carvedilol 3.125 mg twice a day  Current lipid meds: atrovastatin 80mg  daily, Zetia 10mg  daily   Previously tried: metoprolol (changed to carvedilol for better BP lowering) BP goal: <130/80 mmHg  LDL goal: <55 Risk Factors: history of stroke, coronary CTA, LDL 150   Family History: The patient'sfamily history includes Blindness in his maternal aunt, Hypertension in his father and mother.  Social History: Neg ETOH, neg tobacco  Diet: 1 cup of coffee per day, does not salt food, wife dose not cook with salt, sometimes eats TV dinner,  restaurants twice a week; cutting down on portions and eating broiled chicken.  - Was eating boiled chicken, rice, and soft foods for the last ~9 months due to teeth removal.   Exercise: walking ~29min, 5 days a week   Home BP readings: not checking at home   Clinic readings:   Labs: Lipid panel 12/20/19: TC 111, TG 89, HDL 32, LDL 62  (atorvastatin 80mg  daily, Zetia 10mg  daily)  Wt Readings from Last 3 Encounters:  12/04/20 258 lb 3.2 oz (117.1 kg)  11/15/19 272 lb 9.6 oz (123.7 kg)  08/19/19 275 lb 1.9 oz (124.8 kg)   BP Readings from Last 3 Encounters:  01/30/21 138/76  01/07/21 (!) 158/82  12/04/20 (!) 156/84   Pulse Readings from Last 3 Encounters:  01/30/21 (!) 51  01/07/21 (!) 59  12/04/20 (!) 54    Renal function: CrCl cannot be calculated (Patient's most recent lab result is older than the maximum 21 days allowed.).  Past Medical History:  Diagnosis Date  . CAD (coronary artery disease), native coronary artery    coronary CTA with occluded RCA with distal collaterals and otherwise nonobstructive disease.  . Essential hypertension   . GERD without esophagitis   . HLD (hyperlipidemia)   . HTN (hypertension)   . LVH (left ventricular hypertrophy)   . Mixed hyperlipidemia   . Noncompliance   . Obesity   . Other specified hypothyroidism   .  Prediabetes   . Stroke [facility])     Current Outpatient Medications on File Prior to Visit  Medication Sig Dispense Refill  . amLODipine (NORVASC) 10 MG tablet Take 1 tablet (10 mg total) by mouth daily. 90 tablet 3  . aspirin EC 81 MG tablet Take 1 tablet (81 mg total) by mouth daily. 90 tablet 3  . atorvastatin (LIPITOR) 80 MG tablet Take 1 tablet (80 mg total) by mouth daily. 90 tablet 3  . carvedilol (COREG) 3.125 MG tablet Take 1 tablet (3.125 mg total) by mouth 2 (two) times daily. 180 tablet 3  . clopidogrel (PLAVIX) 75 MG tablet Take 1 tablet (75 mg total) by mouth daily. 90 tablet 3  . ezetimibe (ZETIA) 10 MG  tablet TAKE 1 TABLET BY MOUTH ONCE DAILY. 90 tablet 3  . hydrALAZINE (APRESOLINE) 25 MG tablet Take 1 tablet (25 mg total) by mouth 3 (three) times daily. 270 tablet 3  . hydrochlorothiazide (HYDRODIURIL) 25 MG tablet Take 1 tablet (25 mg total) by mouth daily. 90 tablet 3  . Levothyroxine Sodium 88 MCG CAPS Take 1 capsule (88 mcg total) by mouth daily before breakfast. 30 capsule 0  . lisinopril (ZESTRIL) 40 MG tablet TAKE 1 TABLET BY MOUTH ONCE DAILY. 90 tablet 3  . potassium chloride SA (KLOR-CON M20) 20 MEQ tablet Take 1 tablet (20 mEq total) by mouth daily. 90 tablet 3   No current facility-administered medications on file prior to visit.    No Known Allergies   Assessment/Plan:  1. Hypertension - Patient's blood pressure remains above goal (<130/80) in clinic. No home readings to compare. Will increase hydralazine to 25mg  three times a day. Advised patient that he may take 2 of the 10mg  tablets three times a day until he runs out, then he can start 25mg  three times a day. Continue amlodipine 10mg  daily, lisinopril 40mg  daily, HCTZ 25mg  daily and carvedilol 3.125 mg twice a day. I have asked the patient to check his blood pressure at home once a day and bring his log with him to next appointment. Follow up in 1 month.  2. Hyperlipidemia- Recheck lipids at next apt 6/1  , Pharm.D, BCPS, CPP [facility]  [address] [place],   Phone: [phone]; Fax: [phone]

## 2021-03-11 ENCOUNTER — Other Ambulatory Visit: Payer: Self-pay

## 2021-04-04 ENCOUNTER — Ambulatory Visit (INDEPENDENT_AMBULATORY_CARE_PROVIDER_SITE_OTHER): Payer: Self-pay | Admitting: Pharmacist

## 2021-04-04 ENCOUNTER — Other Ambulatory Visit: Payer: Self-pay

## 2021-04-04 VITALS — BP 128/68 | HR 54

## 2021-04-04 DIAGNOSIS — E7800 Pure hypercholesterolemia, unspecified: Secondary | ICD-10-CM

## 2021-04-04 DIAGNOSIS — I10 Essential (primary) hypertension: Secondary | ICD-10-CM

## 2021-04-04 LAB — LIPID PANEL
Chol/HDL Ratio: 3.2 ratio (ref 0.0–5.0)
Cholesterol, Total: 116 mg/dL (ref 100–199)
HDL: 36 mg/dL — ABNORMAL LOW (ref >39)
LDL Chol Calc (NIH): 65 mg/dL (ref 0–99)
Triglycerides: 70 mg/dL (ref 0–149)
VLDL Cholesterol Cal: 15 mg/dL (ref 5–40)

## 2021-04-04 NOTE — Progress Notes (Signed)
Patient ID: Scott Richardson                 DOB: 29-Aug-1956                      MRN: [id number]     HPI: Cuthbert Turton is a 65 y.o. male referred by Dr. Mayford Knife to HTN clinic. PMH is significant for HTN, HLD, OSA, stroke, ASCAD with coronary CTA showing occluded RCA with distal collaterals and otherwise nonobstructive disease.  He has a history of hypertensive DCM which resolved on aggressive medical therapy of his HTN.   Patient previously seen in the HTN clinic on 01/07/21. His blood pressure was above goal at 158/82. Patient had been followed previously. Blood pressure was under pretty good control. However when seen by Dr. Mayford Knife on 3/8, his blood pressure was elevated. It was discovered he was not taking his hydralazine. At follow up on 4/11 patient was not taking hydralazine three times a day.  At last visit on 5/4, blood pressure was 138/76. Hydralazine was increased to 25mg  three times a day. He was supposed to follow up in 1 month, but did not show up to his appointment.  Patient presents today for follow up. He reports mild dizziness when he sits up out of bed that lasts only a few seconds. Denies headache, blurred vision, SOB or swelling. Has not been checking his blood pressure because his mother in law took his cuff. He did get a new one. Still walking 5 days a week and report compliance with his medications. Has not taken his medications today yet because he has fasting blood work this AM.   Current HTN meds: amlodipine 10mg  daily, lisinopril 40mg  daily, HCTZ 25mg  daily, hydralazine 25 mg three times a day, carvedilol 3.125 mg twice a day  Current lipid meds: atrovastatin 80mg  daily, Zetia 10mg  daily   Previously tried: metoprolol (changed to carvedilol for better BP lowering) BP goal: <130/80 mmHg  LDL goal: <55 Risk Factors: history of stroke, coronary CTA, LDL 150   Family History: The patient's family history includes Blindness in his maternal aunt, Hypertension in his father and  mother.  Social History: Neg ETOH, neg tobacco  Diet: 1 cup of coffee per day, does not salt food, wife dose not cook with salt, sometimes eats TV dinner, restaurants twice a week; cutting down on portions and eating broiled chicken.  - Was eating boiled chicken, rice, and soft foods for the last ~9 months due to teeth removal.   Exercise: walking ~35-49min, 5 days a week   Home BP readings: reports 129/60  Clinic readings:   Labs: Lipid panel 12/20/19: TC 111, TG 89, HDL 32, LDL 62  (atorvastatin 80mg  daily, Zetia 10mg  daily)  Wt Readings from Last 3 Encounters:  12/04/20 258 lb 3.2 oz (117.1 kg)  11/15/19 272 lb 9.6 oz (123.7 kg)  08/19/19 275 lb 1.9 oz (124.8 kg)   BP Readings from Last 3 Encounters:  01/30/21 138/76  01/07/21 (!) 158/82  12/04/20 (!) 156/84   Pulse Readings from Last 3 Encounters:  01/30/21 (!) 51  01/07/21 (!) 59  12/04/20 (!) 54    Renal function: CrCl cannot be calculated (Patient's most recent lab result is older than the maximum 21 days allowed.).  Past Medical History:  Diagnosis Date   CAD (coronary artery disease), native coronary artery    coronary CTA with occluded RCA with distal collaterals and otherwise nonobstructive disease.   Essential hypertension  GERD without esophagitis    HLD (hyperlipidemia)    HTN (hypertension)    LVH (left ventricular hypertrophy)    Mixed hyperlipidemia    Noncompliance    Obesity    Other specified hypothyroidism    Prediabetes    Stroke [facility])     Current Outpatient Medications on File Prior to Visit  Medication Sig Dispense Refill   amLODipine (NORVASC) 10 MG tablet Take 1 tablet (10 mg total) by mouth daily. 90 tablet 3   aspirin EC 81 MG tablet Take 1 tablet (81 mg total) by mouth daily. 90 tablet 3   atorvastatin (LIPITOR) 80 MG tablet Take 1 tablet (80 mg total) by mouth daily. 90 tablet 3   carvedilol (COREG) 3.125 MG tablet Take 1 tablet (3.125 mg total) by mouth 2 (two) times  daily. 180 tablet 3   clopidogrel (PLAVIX) 75 MG tablet Take 1 tablet (75 mg total) by mouth daily. 90 tablet 3   ezetimibe (ZETIA) 10 MG tablet TAKE 1 TABLET BY MOUTH ONCE DAILY. 90 tablet 3   hydrALAZINE (APRESOLINE) 25 MG tablet Take 1 tablet (25 mg total) by mouth 3 (three) times daily. 270 tablet 3   hydrochlorothiazide (HYDRODIURIL) 25 MG tablet Take 1 tablet (25 mg total) by mouth daily. 90 tablet 3   Levothyroxine Sodium 88 MCG CAPS Take 1 capsule (88 mcg total) by mouth daily before breakfast. 30 capsule 0   lisinopril (ZESTRIL) 40 MG tablet TAKE 1 TABLET BY MOUTH ONCE DAILY. 90 tablet 3   potassium chloride SA (KLOR-CON M20) 20 MEQ tablet Take 1 tablet (20 mEq total) by mouth daily. 90 tablet 3   No current facility-administered medications on file prior to visit.    No Known Allergies   Assessment/Plan:  1. Hypertension - Patient's blood pressure is at goal (<130/80) in clinic. He states his blood pressure is 129/60 at home. Continue amlodipine 10mg  daily, lisinopril 40mg  daily, HCTZ 25mg  daily, hydralazine 25 mg three times a day and carvedilol 3.125 mg twice a day. I have asked the patinent to continue to check his blood pressure at home and I will call him in 1 month to follow up.  I advised him to take him time getting out of bed. To sit on edge of bed until his dizziness resolves.  2. Hyperlipidemia- Lipids checked this AM. Continue atorvastatin 80mg  and Zetia 10mg  daily. Further recommendations after labs result.  , Pharm.D, BCPS, CPP  [facility]  [address] [place],   Phone: [phone]; Fax: [phone]

## 2021-04-04 NOTE — Patient Instructions (Addendum)
Continue amlodipine 10mg  daily, lisinopril 40mg  daily, HCTZ 25mg  daily, hydralazine 25 mg three times a day, carvedilol 3.125 mg twice a day, atrovastatin 80mg  daily, Zetia 10mg  daily  Call me with any problems [phone]  I will call you in 1 month to see how your blood pressure is  Continue walking most days of the week and checking your blood pressure.

## 2021-04-05 ENCOUNTER — Telehealth: Payer: Self-pay | Admitting: Pharmacist

## 2021-04-05 NOTE — Telephone Encounter (Signed)
Called and let patient know that his labs look good. Continue atorvastatin 80mg  an zetia 10mg  daily.

## 2021-05-24 ENCOUNTER — Telehealth: Payer: Self-pay | Admitting: Pharmacist

## 2021-05-24 NOTE — Telephone Encounter (Signed)
Called pt to follow up on how his blood pressure was doing. LVM for pt to call back

## 2021-07-10 NOTE — Telephone Encounter (Signed)
Called patient to see how his blood pressure was doing. He states its "really good" When asked what some of his reading were, he states he forgets but knows its good. Reminded him that his goal is <130/80 and to call me if his BP is higher than this.

## 2022-09-17 DIAGNOSIS — I6389 Other cerebral infarction: Secondary | ICD-10-CM | POA: Diagnosis not present

## 2023-01-29 NOTE — Progress Notes (Deleted)
Cardiology Office Note:    Date:  01/29/2023   ID:  Scott Richardson, DOB 1955/12/15, MRN [id number]  PCP:  Galvin Proffer, MD   [facility] Providers Cardiologist:  Armanda Magic, MD { Click to update primary MD,subspecialty MD or APP then REFRESH:1}    Referring MD: Galvin Proffer, MD   Chief Complaint: ***  History of Present Illness:    Scott Richardson is a *** 67 y.o. male with a hx of CAD with coronary CTA showing occluded RCA with distant collaterals and otherwise nonobstructive disease, hypertensive DCM which resolved with aggressive medical therapy of HTN, OSA, HLD, CVA, obesity, and RBBB.  Echo with LVEF 30-35% 11/2013 with normalization of EF on echo 02/2014 with EF 55 to 60%. Most recent TTE 09/17/22 with LVEF 60-655, trivial MR and TR, moderate LVH, slightly enlarged RV with normal function, poorly visualized ascending and descending aorta.   He underwent nuclear stress test 06/28/2014 that showed moderately fixed defect consistent with a scar with mildly reduced LVEF, no evidence of ischemia.  Coronary CTA 06/2014 revealed occluded proximal RCA with distal collaterals, nonobstructive plaque in LM and proximal and mid LAD and left circumflex. He has remained asymptomatic.   He underwent sleep study in 2020 showing moderate OSA with an AHI of 21.3/hr and nocturnal hypoxemia with 41% of the time spent with O2 sats < 85% with minimum O2 sat of 76%.  He underwent CPAP titration but unfortunately later lost his insurance and could not afford his device.  Seen by Dr. Mayford Knife 12/04/2020 at which time BP was elevated 156/84.  He was advised to resume hydralazine 10 mg 3 times daily and continue amlodipine 10 mg daily, HCTZ 25 mg daily, lisinopril 40 mg daily, and carvedilol 3.125 mg twice daily.  Unable to increase carvedilol further due to bradycardia. He was advised to follow-up with Pharm.D. clinic in 2 weeks and was seen by Malena Peer, RPH. Hydralazine was increased to 25 mg three times  daily and BP was at goal at follow-up appointment on 04/04/21.  Today, he is here for overdue follow-up.   Past Medical History:  Diagnosis Date   CAD (coronary artery disease), native coronary artery    coronary CTA with occluded RCA with distal collaterals and otherwise nonobstructive disease.   Essential hypertension    GERD without esophagitis    HLD (hyperlipidemia)    HTN (hypertension)    LVH (left ventricular hypertrophy)    Mixed hyperlipidemia    Noncompliance    Obesity    Other specified hypothyroidism    Prediabetes    Stroke [facility])     Past Surgical History:  Procedure Laterality Date   NO PAST SURGERIES     TEE WITHOUT CARDIOVERSION N/A 12/01/2013   Procedure: TRANSESOPHAGEAL ECHOCARDIOGRAM (TEE);  Surgeon: Vesta Mixer, MD;  Location: [facility] ENDOSCOPY;  Service: Cardiovascular;  Laterality: N/A;    Current Medications: No outpatient medications have been marked as taking for the 01/30/23 encounter (Appointment) with [name], [name], NP.     Allergies:   Patient has no known allergies.   Social History   Socioeconomic History   Marital status: Married    Spouse name: Scott Richardson   Number of children: 2   Years of education: HS   Highest education level: Not on file  Occupational History    Employer: OTHER    Comment: n/a  Tobacco Use   Smoking status: Never   Smokeless tobacco: Never  Vaping Use  Vaping Use: Never used  Substance and Sexual Activity   Alcohol use: No    Alcohol/week: 0.0 standard drinks of alcohol   Drug use: No   Sexual activity: Not Currently  Other Topics Concern   Not on file  Social History Narrative   Patient is married with 2 children.   Patient is right handed.   Patient has hs education.   Patient drinks 1-2 cups daily.   Social Determinants of Health   Financial Resource Strain: Not on file  Food Insecurity: Not on file  Transportation Needs: Not on file  Physical Activity: Not on file  Stress: Not on file   Social Connections: Not on file     Family History: The patient's ***family history includes Blindness in his maternal aunt, maternal aunt, maternal aunt, and maternal aunt; Hypertension in his father and mother.  ROS:   Please see the history of present illness.    *** All other systems reviewed and are negative.  Labs/Other Studies Reviewed:    The following studies were reviewed today:  Echo 09/17/22 LVEF 60-65%, moderate LVH, impaired relaxation Slightly enlarged RV with normal systolic function Trivial MR and trivial TR  Coronary CT 06/2014 IMPRESSION: 1) Calcium Score 130 71st percentile for age and sex matched controls   2) Definite CAD. Occluded proximal RCA with likely distal collaterals Non obstructive mixed plaque in the LM, proximal and mid LAD and mid circumflex   3) No significant non coronary findings   Overall images somewhat suboptimal due to patent motion and size   Recent Labs: No results found for requested labs within last 365 days.  Recent Lipid Panel    Component Value Date/Time   CHOL 116 04/04/2021 0841   TRIG 70 04/04/2021 0841   HDL 36 (L) 04/04/2021 0841   CHOLHDL 3.2 04/04/2021 0841   CHOLHDL 3.5 11/19/2015 0850   VLDL 15 11/19/2015 0850   LDLCALC 65 04/04/2021 0841     Risk Assessment/Calculations:   {Does this patient have ATRIAL FIBRILLATION?[phone]}       Physical Exam:    VS:  There were no vitals taken for this visit.    Wt Readings from Last 3 Encounters:  12/04/20 258 lb 3.2 oz (117.1 kg)  11/15/19 272 lb 9.6 oz (123.7 kg)  08/19/19 275 lb 1.9 oz (124.8 kg)     GEN: *** Well nourished, well developed in no acute distress HEENT: Normal NECK: No JVD; No carotid bruits CARDIAC: ***RRR, no murmurs, rubs, gallops RESPIRATORY:  Clear to auscultation without rales, wheezing or rhonchi  ABDOMEN: Soft, non-tender, non-distended MUSCULOSKELETAL:  No edema; No deformity. *** pedal pulses, ***bilaterally SKIN: Warm  and dry NEUROLOGIC:  Alert and oriented x 3 PSYCHIATRIC:  Normal affect   EKG:  EKG is *** ordered today.  The ekg ordered today demonstrates ***  No BP recorded.  {Refresh Note OR Click here to enter BP  :1}***    Diagnoses:    No diagnosis found. Assessment and Plan:     CAD without angina: Hypertension: Hyperlipidemia:  Cardiomyopathy: Normal LVEF 60-65%, moderate LVH, impaired diastolic function on echo 08/2022.   {Are you ordering a CV Procedure (e.g. stress test, cath, DCCV, TEE, etc)?   Press F2        :[id number]}   Disposition:  Medication Adjustments/Labs and Tests Ordered: Current medicines are reviewed at length with the patient today.  Concerns regarding medicines are outlined above.  No orders of the defined types were placed in this  encounter.  No orders of the defined types were placed in this encounter.   There are no Patient Instructions on file for this visit.   Signed, Levi Aland, NP  01/29/2023 5:31 AM    [facility]

## 2023-01-30 ENCOUNTER — Ambulatory Visit: Payer: Medicare PPO | Admitting: Nurse Practitioner

## 2023-03-02 NOTE — Progress Notes (Deleted)
Office Visit    Patient Name: Scott Richardson Date of Encounter: 03/02/2023  PCP:  Scott Proffer, MD   [facility]  Cardiologist:  Scott Magic, MD  Advanced Practice Provider:  No care team member to display Electrophysiologist:  None   HPI    Scott Richardson is a 67 y.o. male with past medical history of his CAD with coronary CTA showing occluded RCA with distal collaterals and otherwise nonobstructive disease, hypertensive DCM which resolved with aggressive medical therapy, moderate OSA with use of CPAP, LVH, prediabetes, stroke, and noncompliance presents today for follow-up appointment.  Was seen by Dr. Mayford Richardson 11/2020 and at that time he underwent CPAP titration.  Could not afford his device in the past.  Denied chest pain at his last appointment.  No chest pressure, SOB, DOE, PND, orthopnea, lower extremity edema, dizziness, palpitations, or syncope.  Compliant with medications.  Today, he***  Past Medical History    Past Medical History:  Diagnosis Date   CAD (coronary artery disease), native coronary artery    coronary CTA with occluded RCA with distal collaterals and otherwise nonobstructive disease.   Essential hypertension    GERD without esophagitis    HLD (hyperlipidemia)    HTN (hypertension)    LVH (left ventricular hypertrophy)    Mixed hyperlipidemia    Noncompliance    Obesity    Other specified hypothyroidism    Prediabetes    Stroke [facility])    Past Surgical History:  Procedure Laterality Date   NO PAST SURGERIES     TEE WITHOUT CARDIOVERSION N/A 12/01/2013   Procedure: TRANSESOPHAGEAL ECHOCARDIOGRAM (TEE);  Surgeon: Scott Mixer, MD;  Location: [facility] ENDOSCOPY;  Service: Cardiovascular;  Laterality: N/A;    Allergies  No Known Allergies  EKGs/Labs/Other Studies Reviewed:   The following studies were reviewed today: Cardiac Studies & Procedures          CT SCANS  CT CORONARY MORPH W/CTA COR W/SCORE 07/14/2014  Addendum  07/14/2014  5:41 PM ADDENDUM REPORT: 07/14/2014 17:39  CLINICAL DATA:  Chest pain  EXAM: Cardiac CTA  MEDICATIONS: Sub lingual nitro. 4mg  and lopressor 5mg   TECHNIQUE: The patient was scanned on a Philips 256 slice scanner. Gantry rotation speed was 270 msecs. Collimation was .9mm. A 100 kV prospective scan was triggered in the descending thoracic aorta at 111 HU's with 5% padding centered around 78% of the R-R interval. Average HR during the scan was 72 bpm. The 3D data set was interpreted on a dedicated work station using MPR, MIP and VRT modes. A total of 80cc of contrast was used.  FINDINGS: Non-cardiac: See separate report from [facility]. No significant findings on limited lung and soft tissue windows.  Calcium Score:  Coronary Arteries: Right dominant with no anomalies  LM:  50% or less mixed calcified plaque  LAD:  50% or less mixed plaque in the proximal and mid vessel  D1: Small normal  D2: Small normal  D3:  Small and normal  Circumflex: Motion artifact and tortuosity make mid vessel hard to interpret. 50% mixed plaque in the mid vessel at take off of OM1  OM1: Less than 50% soft plaque in proximal vessel  Distal AV groove branch appears large vs collateral with no significant disease  RCA: Appears occluded proximally. Minimal dye seen in mid vessel PDA/PLA seen much better suggesting collaterals to distal RCA  PDA:  Likely fill from collaterals  PLA:  Likely fill from collaterals  IMPRESSION: 1)  Calcium Score 130 71st percentile for age and sex matched controls  2) Definite CAD. Occluded proximal RCA with likely distal collaterals Non obstructive mixed plaque in the LM, proximal and mid LAD and mid circumflex  3) No significant non coronary findings  Overall images somewhat suboptimal due to patent motion and size  Scott Richardson   Electronically Signed By: Scott Richardson M.D. On: 07/14/2014 17:39  Narrative EXAM: OVER-READ  INTERPRETATION  CT CHEST  The following report is an over-read performed by radiologist Dr. Royal Richardson [facility] on 07/14/2014. This over-read does not include interpretation of cardiac or coronary anatomy or pathology. The coronary calcium score/coronary CTA interpretation by the cardiologist is attached.  COMPARISON:  No priors.  FINDINGS: Small amount of subsegmental atelectasis or scarring in the inferior segment of the lingula. Within the visualized portions of the thorax there is no acute consolidative airspace disease, no pneumothorax, no suspicious appearing pulmonary nodule or mass, and no lymphadenopathy. Visualized portions of the upper abdomen are unremarkable. There are no aggressive appearing lytic or blastic lesions noted in the visualized portions of the skeleton.  IMPRESSION: 1. No significant incidental noncardiac findings noted.  Electronically Signed: By: Scott Richardson M.D. On: 07/14/2014 16:08           EKG:  EKG is *** ordered today.  The ekg ordered today demonstrates ***  Recent Labs: No results found for requested labs within last 365 days.  Recent Lipid Panel    Component Value Date/Time   CHOL 116 04/04/2021 0841   TRIG 70 04/04/2021 0841   HDL 36 (L) 04/04/2021 0841   CHOLHDL 3.2 04/04/2021 0841   CHOLHDL 3.5 11/19/2015 0850   VLDL 15 11/19/2015 0850   LDLCALC 65 04/04/2021 0841    Risk Assessment/Calculations:  {Does this patient have ATRIAL FIBRILLATION?[phone]}  Home Medications   No outpatient medications have been marked as taking for the 03/03/23 encounter (Appointment) with Scott Dory, PA-C.     Review of Systems   ***   All other systems reviewed and are otherwise negative except as noted above.  Physical Exam    VS:  There were no vitals taken for this visit. , BMI There is no height or weight on file to calculate BMI.  Wt Readings from Last 3 Encounters:  12/04/20 258 lb 3.2 oz (117.1 kg)   11/15/19 272 lb 9.6 oz (123.7 kg)  08/19/19 275 lb 1.9 oz (124.8 kg)     GEN: Well nourished, well developed, in no acute distress. HEENT: normal. Neck: Supple, no JVD, carotid bruits, or masses. Cardiac: ***RRR, no murmurs, rubs, or gallops. No clubbing, cyanosis, edema.  ***Radials/PT 2+ and equal bilaterally.  Respiratory:  ***Respirations regular and unlabored, clear to auscultation bilaterally. GI: Soft, nontender, nondistended. MS: No deformity or atrophy. Skin: Warm and dry, no rash. Neuro:  Strength and sensation are intact. Psych: Normal affect.  Assessment & Plan    Coronary artery disease Hypertension Cardiomyopathy due to hypertension OSA Hyperlipidemia History of CVA  No BP recorded.  {Refresh Note OR Click here to enter BP  :1}***      Disposition: Follow up {follow up:15908} with Scott Magic, MD or APP.  Signed, Scott Dory, PA-C 03/02/2023, 1:25 PM [facility]

## 2023-03-03 ENCOUNTER — Ambulatory Visit: Payer: Medicare PPO | Attending: Nurse Practitioner | Admitting: Physician Assistant

## 2023-03-03 ENCOUNTER — Encounter: Payer: Self-pay | Admitting: Physician Assistant

## 2023-03-03 DIAGNOSIS — G4733 Obstructive sleep apnea (adult) (pediatric): Secondary | ICD-10-CM

## 2023-03-03 DIAGNOSIS — I10 Essential (primary) hypertension: Secondary | ICD-10-CM

## 2023-03-03 DIAGNOSIS — I2510 Atherosclerotic heart disease of native coronary artery without angina pectoris: Secondary | ICD-10-CM

## 2023-03-03 DIAGNOSIS — E785 Hyperlipidemia, unspecified: Secondary | ICD-10-CM

## 2023-03-12 ENCOUNTER — Other Ambulatory Visit (HOSPITAL_COMMUNITY): Payer: Self-pay | Admitting: Urology

## 2023-03-12 DIAGNOSIS — R972 Elevated prostate specific antigen [PSA]: Secondary | ICD-10-CM

## 2023-03-12 DIAGNOSIS — C61 Malignant neoplasm of prostate: Secondary | ICD-10-CM

## 2023-03-31 ENCOUNTER — Encounter (HOSPITAL_COMMUNITY)
Admission: RE | Admit: 2023-03-31 | Discharge: 2023-03-31 | Disposition: A | Discharge status: Home / Self Care | Payer: Medicare PPO | Source: Ambulatory Visit | Attending: Urology | Admitting: Urology

## 2023-03-31 DIAGNOSIS — C61 Malignant neoplasm of prostate: Secondary | ICD-10-CM | POA: Insufficient documentation

## 2023-03-31 DIAGNOSIS — R972 Elevated prostate specific antigen [PSA]: Secondary | ICD-10-CM | POA: Insufficient documentation

## 2023-03-31 MED ORDER — PIFLIFOLASTAT F 18 (PYLARIFY) INJECTION
9.0000 | Freq: Once | INTRAVENOUS | Status: AC
  Administered 2023-03-31: 9.7 via INTRAVENOUS

## 2023-05-19 ENCOUNTER — Other Ambulatory Visit: Payer: Self-pay

## 2023-05-19 ENCOUNTER — Inpatient Hospital Stay: Payer: Medicare PPO

## 2023-05-19 DIAGNOSIS — C61 Malignant neoplasm of prostate: Secondary | ICD-10-CM

## 2023-05-19 LAB — CBC AND DIFFERENTIAL
HCT: 39 — AB (ref 41–53)
Hemoglobin: 13.3 — AB (ref 13.5–17.5)
Neutrophils Absolute: 3.88
Platelets: 179 10*3/uL (ref 150–400)
WBC: 5.1

## 2023-05-19 LAB — CBC: RBC: 4.34 (ref 3.87–5.11)

## 2023-05-22 ENCOUNTER — Telehealth: Payer: Self-pay

## 2023-05-22 NOTE — Telephone Encounter (Signed)
Pt is receiving radiation treatments for prostate cancer. I will speak w/Dr Thersa Salt to get more information.

## 2023-08-03 ENCOUNTER — Ambulatory Visit: Payer: Medicare PPO | Admitting: Radiation Oncology

## 2023-08-05 ENCOUNTER — Ambulatory Visit
Admission: RE | Admit: 2023-08-05 | Discharge: 2023-08-05 | Disposition: A | Discharge status: Home / Self Care | Payer: Medicare PPO | Source: Ambulatory Visit | Attending: Radiation Oncology | Admitting: Radiation Oncology

## 2023-08-05 VITALS — BP 145/65 | HR 52 | Temp 97.9°F | Resp 18 | Ht 68.3 in | Wt 260.4 lb

## 2023-08-05 DIAGNOSIS — C61 Malignant neoplasm of prostate: Secondary | ICD-10-CM

## 2023-08-05 NOTE — Progress Notes (Signed)
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  He completed external beam radiation approximately 6 weeks ago.  This is his first follow-up visit.  He continues to receive androgen deprivation therapy under the care of Dr. Saddie Benders.  ROS: He reports that his energy level and appetite are both good.  His weight today is up several pounds.  He is bothered by daily hot flashes.  He reports rare nocturia.  He reports no hematuria.  PE: Weight 260.4 temperature 97.9.  He is in no apparent distress.  Abdomen is nontender and nondistended.  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  His radiation related side effects have largely resolved.  He continues to experience daily hot flashes secondary to his androgen deprivation therapy.  He is scheduled to see Dr. Saddie Benders in 3 months.  As he will continue routine follow-up with Dr. Saddie Benders, further follow-up in our department will be on a as needed basis, though I encouraged him to contact me at anytime with any questions or concerns he may have.Rudy Jew Thersa Salt, MD

## 2023-11-11 ENCOUNTER — Ambulatory Visit: Payer: Medicare PPO | Attending: Cardiology | Admitting: Cardiology

## 2024-01-21 ENCOUNTER — Other Ambulatory Visit: Payer: Self-pay | Admitting: Specialist

## 2024-01-21 DIAGNOSIS — K604 Rectal fistula, unspecified: Secondary | ICD-10-CM

## 2024-02-02 ENCOUNTER — Ambulatory Visit
Admission: RE | Admit: 2024-02-02 | Discharge: 2024-02-02 | Disposition: A | Discharge status: Home / Self Care | Source: Ambulatory Visit | Attending: Specialist | Admitting: Specialist

## 2024-02-02 DIAGNOSIS — K604 Rectal fistula, unspecified: Secondary | ICD-10-CM

## 2024-02-02 MED ORDER — GADOPICLENOL 0.5 MMOL/ML IV SOLN
10.0000 mL | Freq: Once | INTRAVENOUS | Status: AC | PRN
Start: 2024-02-02 — End: 2024-02-02
  Administered 2024-02-02: 10 mL via INTRAVENOUS
